# Patient Record
Sex: Female | Born: 1970 | Hispanic: Yes | Marital: Married | Smoking: Never smoker
Health system: Southern US, Community
[De-identification: ages and names within clinical notes are randomized; demographics above are authoritative.]

## PROBLEM LIST (undated history)

## (undated) DIAGNOSIS — E785 Hyperlipidemia, unspecified: Secondary | ICD-10-CM

## (undated) DIAGNOSIS — K219 Gastro-esophageal reflux disease without esophagitis: Secondary | ICD-10-CM

## (undated) DIAGNOSIS — E1140 Type 2 diabetes mellitus with diabetic neuropathy, unspecified: Secondary | ICD-10-CM

## (undated) DIAGNOSIS — F419 Anxiety disorder, unspecified: Secondary | ICD-10-CM

## (undated) DIAGNOSIS — F32A Depression, unspecified: Secondary | ICD-10-CM

## (undated) DIAGNOSIS — E119 Type 2 diabetes mellitus without complications: Secondary | ICD-10-CM

## (undated) DIAGNOSIS — F329 Major depressive disorder, single episode, unspecified: Secondary | ICD-10-CM

## (undated) HISTORY — DX: Gastro-esophageal reflux disease without esophagitis: K21.9

## (undated) HISTORY — DX: Anxiety disorder, unspecified: F41.9

## (undated) HISTORY — DX: Type 2 diabetes mellitus with diabetic neuropathy, unspecified: E11.40

## (undated) HISTORY — DX: Depression, unspecified: F32.A

## (undated) HISTORY — DX: Hyperlipidemia, unspecified: E78.5

---
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
## 2015-05-25 DIAGNOSIS — B3731 Acute candidiasis of vulva and vagina: Secondary | ICD-10-CM | POA: Insufficient documentation

## 2015-05-28 ENCOUNTER — Ambulatory Visit: Payer: Self-pay

## 2016-05-24 DIAGNOSIS — F418 Other specified anxiety disorders: Secondary | ICD-10-CM | POA: Insufficient documentation

## 2016-08-30 DIAGNOSIS — B353 Tinea pedis: Secondary | ICD-10-CM | POA: Insufficient documentation

## 2016-08-30 DIAGNOSIS — E785 Hyperlipidemia, unspecified: Secondary | ICD-10-CM | POA: Insufficient documentation

## 2017-03-31 ENCOUNTER — Emergency Department (HOSPITAL_COMMUNITY): Payer: Self-pay

## 2017-03-31 ENCOUNTER — Observation Stay (HOSPITAL_COMMUNITY)
Admission: EM | Admit: 2017-03-31 | Discharge: 2017-04-01 | Disposition: A | Discharge status: Home / Self Care | Payer: Self-pay | Attending: Internal Medicine | Admitting: Internal Medicine

## 2017-03-31 ENCOUNTER — Encounter (HOSPITAL_COMMUNITY): Payer: Self-pay

## 2017-03-31 DIAGNOSIS — Z9119 Patient's noncompliance with other medical treatment and regimen: Secondary | ICD-10-CM | POA: Insufficient documentation

## 2017-03-31 DIAGNOSIS — R824 Acetonuria: Secondary | ICD-10-CM

## 2017-03-31 DIAGNOSIS — J322 Chronic ethmoidal sinusitis: Secondary | ICD-10-CM | POA: Insufficient documentation

## 2017-03-31 DIAGNOSIS — R5381 Other malaise: Secondary | ICD-10-CM

## 2017-03-31 DIAGNOSIS — E1110 Type 2 diabetes mellitus with ketoacidosis without coma: Principal | ICD-10-CM | POA: Diagnosis present

## 2017-03-31 DIAGNOSIS — F419 Anxiety disorder, unspecified: Secondary | ICD-10-CM | POA: Insufficient documentation

## 2017-03-31 DIAGNOSIS — R112 Nausea with vomiting, unspecified: Secondary | ICD-10-CM

## 2017-03-31 DIAGNOSIS — R5383 Other fatigue: Secondary | ICD-10-CM

## 2017-03-31 DIAGNOSIS — R519 Headache, unspecified: Secondary | ICD-10-CM

## 2017-03-31 DIAGNOSIS — J323 Chronic sphenoidal sinusitis: Secondary | ICD-10-CM | POA: Insufficient documentation

## 2017-03-31 DIAGNOSIS — J0100 Acute maxillary sinusitis, unspecified: Secondary | ICD-10-CM | POA: Insufficient documentation

## 2017-03-31 DIAGNOSIS — R51 Headache: Secondary | ICD-10-CM

## 2017-03-31 DIAGNOSIS — F329 Major depressive disorder, single episode, unspecified: Secondary | ICD-10-CM | POA: Insufficient documentation

## 2017-03-31 HISTORY — DX: Type 2 diabetes mellitus without complications: E11.9

## 2017-03-31 LAB — URINALYSIS, ROUTINE W REFLEX MICROSCOPIC
Bilirubin Urine: NEGATIVE
HGB URINE DIPSTICK: NEGATIVE
Ketones, ur: 20 mg/dL — AB
LEUKOCYTES UA: NEGATIVE
NITRITE: NEGATIVE
PH: 6 (ref 5.0–8.0)
Protein, ur: NEGATIVE mg/dL
RBC / HPF: NONE SEEN RBC/hpf (ref 0–5)
Specific Gravity, Urine: 1.015 (ref 1.005–1.030)
Squamous Epithelial / LPF: NONE SEEN

## 2017-03-31 LAB — COMPREHENSIVE METABOLIC PANEL
ALK PHOS: 95 U/L (ref 38–126)
ALT: 16 U/L (ref 14–54)
ANION GAP: 16 — AB (ref 5–15)
AST: 20 U/L (ref 15–41)
Albumin: 4 g/dL (ref 3.5–5.0)
BILIRUBIN TOTAL: 0.4 mg/dL (ref 0.3–1.2)
BUN: 14 mg/dL (ref 6–20)
CALCIUM: 9.8 mg/dL (ref 8.9–10.3)
CO2: 17 mmol/L — ABNORMAL LOW (ref 22–32)
Chloride: 101 mmol/L (ref 101–111)
Creatinine, Ser: 0.55 mg/dL (ref 0.44–1.00)
GFR calc non Af Amer: >60 mL/min (ref >60)
Glucose, Bld: 266 mg/dL — ABNORMAL HIGH (ref 65–99)
POTASSIUM: 4 mmol/L (ref 3.5–5.1)
SODIUM: 134 mmol/L — AB (ref 135–145)
TOTAL PROTEIN: 7.1 g/dL (ref 6.5–8.1)

## 2017-03-31 LAB — CBC
HEMATOCRIT: 39.8 % (ref 36.0–46.0)
HEMOGLOBIN: 14.5 g/dL (ref 12.0–15.0)
MCH: 32.1 pg (ref 26.0–34.0)
MCHC: 36.4 g/dL — ABNORMAL HIGH (ref 30.0–36.0)
MCV: 88.1 fL (ref 78.0–100.0)
Platelets: 302 10*3/uL (ref 150–400)
RBC: 4.52 MIL/uL (ref 3.87–5.11)
RDW: 12.2 % (ref 11.5–15.5)
WBC: 9.4 10*3/uL (ref 4.0–10.5)

## 2017-03-31 LAB — CBG MONITORING, ED: Glucose-Capillary: 218 mg/dL — ABNORMAL HIGH (ref 65–99)

## 2017-03-31 LAB — I-STAT BETA HCG BLOOD, ED (MC, WL, AP ONLY)

## 2017-03-31 LAB — LIPASE, BLOOD: Lipase: 25 U/L (ref 11–51)

## 2017-03-31 MED ORDER — SODIUM CHLORIDE 0.9 % IV BOLUS (SEPSIS)
1000.0000 mL | Freq: Once | INTRAVENOUS | Status: AC
Start: 2017-03-31 — End: 2017-03-31
  Administered 2017-03-31: 1000 mL via INTRAVENOUS

## 2017-03-31 MED ORDER — ONDANSETRON 4 MG PO TBDP
4.0000 mg | ORAL_TABLET | Freq: Once | ORAL | Status: AC | PRN
  Administered 2017-03-31: 4 mg via ORAL

## 2017-03-31 MED ORDER — SODIUM CHLORIDE 0.9 % IV BOLUS (SEPSIS)
1000.0000 mL | Freq: Once | INTRAVENOUS | Status: AC
  Administered 2017-03-31: 1000 mL via INTRAVENOUS

## 2017-03-31 MED ORDER — PROCHLORPERAZINE EDISYLATE 5 MG/ML IJ SOLN
10.0000 mg | Freq: Once | INTRAMUSCULAR | Status: AC
Start: 2017-03-31 — End: 2017-03-31
  Administered 2017-03-31: 10 mg via INTRAVENOUS
  Filled 2017-03-31: qty 2

## 2017-03-31 MED ORDER — DIPHENHYDRAMINE HCL 50 MG/ML IJ SOLN
25.0000 mg | Freq: Once | INTRAMUSCULAR | Status: AC
  Administered 2017-03-31: 25 mg via INTRAVENOUS
  Filled 2017-03-31: qty 1

## 2017-03-31 MED ORDER — SODIUM CHLORIDE 0.9 % IV SOLN
INTRAVENOUS | Status: AC
  Administered 2017-04-01: 01:00:00 via INTRAVENOUS

## 2017-03-31 MED ORDER — ONDANSETRON 4 MG PO TBDP
ORAL_TABLET | ORAL | Status: AC
  Filled 2017-03-31: qty 1

## 2017-03-31 NOTE — ED Notes (Signed)
  CBG 218  
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
## 2017-03-31 NOTE — ED Triage Notes (Signed)
Patient here with daughter who reports generalized headache intermittently x 3 weeks with nausea/vomiting and back pain with any movement. Crying on arrival. Patient denies trauma. Alert and oriented

## 2017-04-01 ENCOUNTER — Encounter (HOSPITAL_COMMUNITY): Payer: Self-pay | Admitting: Family Medicine

## 2017-04-01 DIAGNOSIS — R5381 Other malaise: Secondary | ICD-10-CM | POA: Diagnosis present

## 2017-04-01 DIAGNOSIS — J329 Chronic sinusitis, unspecified: Secondary | ICD-10-CM

## 2017-04-01 DIAGNOSIS — R5383 Other fatigue: Secondary | ICD-10-CM

## 2017-04-01 DIAGNOSIS — R112 Nausea with vomiting, unspecified: Secondary | ICD-10-CM | POA: Diagnosis present

## 2017-04-01 LAB — BASIC METABOLIC PANEL
ANION GAP: 9 (ref 5–15)
ANION GAP: 7 (ref 5–15)
BUN: 8 mg/dL (ref 6–20)
BUN: 8 mg/dL (ref 6–20)
CALCIUM: 8.4 mg/dL — AB (ref 8.9–10.3)
CALCIUM: 8.1 mg/dL — AB (ref 8.9–10.3)
CHLORIDE: 107 mmol/L (ref 101–111)
CHLORIDE: 107 mmol/L (ref 101–111)
CO2: 21 mmol/L — AB (ref 22–32)
CO2: 23 mmol/L (ref 22–32)
Creatinine, Ser: 0.49 mg/dL (ref 0.44–1.00)
Creatinine, Ser: 0.53 mg/dL (ref 0.44–1.00)
GFR calc Af Amer: >60 mL/min (ref >60)
GFR calc non Af Amer: >60 mL/min (ref >60)
GLUCOSE: 178 mg/dL — AB (ref 65–99)
Glucose, Bld: 161 mg/dL — ABNORMAL HIGH (ref 65–99)
Potassium: 3.6 mmol/L (ref 3.5–5.1)
Potassium: 3.7 mmol/L (ref 3.5–5.1)
Sodium: 137 mmol/L (ref 135–145)
Sodium: 137 mmol/L (ref 135–145)

## 2017-04-01 LAB — HIV ANTIBODY (ROUTINE TESTING W REFLEX): HIV Screen 4th Generation wRfx: NONREACTIVE

## 2017-04-01 LAB — GLUCOSE, CAPILLARY
GLUCOSE-CAPILLARY: 179 mg/dL — AB (ref 65–99)
Glucose-Capillary: 186 mg/dL — ABNORMAL HIGH (ref 65–99)
Glucose-Capillary: 146 mg/dL — ABNORMAL HIGH (ref 65–99)
Glucose-Capillary: 175 mg/dL — ABNORMAL HIGH (ref 65–99)

## 2017-04-01 MED ORDER — HYDROCODONE-ACETAMINOPHEN 5-325 MG PO TABS: 1.0000 | ORAL_TABLET | ORAL | Status: DC | PRN

## 2017-04-01 MED ORDER — PROMETHAZINE HCL 25 MG/ML IJ SOLN: 12.5000 mg | Freq: Four times a day (QID) | INTRAMUSCULAR | Status: DC | PRN

## 2017-04-01 MED ORDER — LIVING WELL WITH DIABETES BOOK - IN SPANISH: 1.0000 | Freq: Once | 0 refills | Status: AC

## 2017-04-01 MED ORDER — ONDANSETRON HCL 4 MG PO TABS: 4.0000 mg | ORAL_TABLET | Freq: Four times a day (QID) | ORAL | Status: DC | PRN

## 2017-04-01 MED ORDER — ACETAMINOPHEN 325 MG PO TABS: 650.0000 mg | ORAL_TABLET | Freq: Four times a day (QID) | ORAL | Status: DC | PRN

## 2017-04-01 MED ORDER — SODIUM CHLORIDE 0.9% FLUSH: 3.0000 mL | Freq: Two times a day (BID) | INTRAVENOUS | Status: DC

## 2017-04-01 MED ORDER — ACURA BLOOD GLUCOSE METER W/DEVICE KIT: 1.0000 [IU] | PACK | Freq: Every day | Status: DC

## 2017-04-01 MED ORDER — INSULIN ASPART 100 UNIT/ML ~~LOC~~ SOLN
0.0000 [IU] | SUBCUTANEOUS | Status: DC
  Administered 2017-04-01 (×2): 3 [IU] via SUBCUTANEOUS
  Administered 2017-04-01: 2 [IU] via SUBCUTANEOUS

## 2017-04-01 MED ORDER — ONDANSETRON HCL 4 MG/2ML IJ SOLN: 4.0000 mg | Freq: Four times a day (QID) | INTRAMUSCULAR | Status: DC | PRN

## 2017-04-01 MED ORDER — LIVING WELL WITH DIABETES BOOK - IN SPANISH
Freq: Once | Status: DC
Start: 2017-04-01 — End: 2017-04-01
  Filled 2017-04-01: qty 1

## 2017-04-01 MED ORDER — POLYETHYLENE GLYCOL 3350 17 G PO PACK: 17.0000 g | PACK | Freq: Every day | ORAL | Status: DC | PRN

## 2017-04-01 MED ORDER — ENOXAPARIN SODIUM 40 MG/0.4ML ~~LOC~~ SOLN
40.0000 mg | SUBCUTANEOUS | Status: DC
  Administered 2017-04-01: 40 mg via SUBCUTANEOUS
  Filled 2017-04-01: qty 0.4

## 2017-04-01 MED ORDER — INSULIN ASPART 100 UNIT/ML ~~LOC~~ SOLN
8.0000 [IU] | Freq: Once | SUBCUTANEOUS | Status: AC
  Administered 2017-04-01: 8 [IU] via SUBCUTANEOUS
  Filled 2017-04-01: qty 1

## 2017-04-01 MED ORDER — ACETAMINOPHEN 650 MG RE SUPP: 650.0000 mg | Freq: Four times a day (QID) | RECTAL | Status: DC | PRN

## 2017-04-01 NOTE — H&P (Signed)
History and Physical    Faith Alvarez WUJ:[id number] DOB: 07-29-1971 DOA: 03/31/2017  PCP: No PCP Per Patient   Patient coming from: Home  Chief Complaint: Nausea, vomiting, headache, malaise   HPI: Faith Alvarez is a 46 y.o. female with medical history significant for type 2 diabetes mellitus, presenting to the emergency department with headache, nonspecific malaise, abdominal discomfort, and nausea with nonbloody nonbilious vomiting. Patient is accompanied by family who assists with the history. She had reportedly been in her usual state until developing nausea with vomiting a couple days ago as well as a nonspecific malaise. She developed a headache today which is similar to the headache she is experienced in the past, but more constant and severe. She describes the headache as a pulsing pain which is constant and generalized to the entire head without change in vision or hearing or focal numbness or weakness. She reports that loud noises or lights makes the headache worse, and no alleviating factors are identified. There has not been any recent fall or trauma. She does not check her sugar at home. She denies abdominal pain, per se, but notes a generalized abdominal discomfort with nausea. There has not been any significant diarrhea and no melena or hematochezia. She denies any significant cough or dyspnea and denies chest pain or palpitations. She denies use of alcohol, illicit drugs, or tobacco.  ED Course: Upon arrival to the ED, patient is found to be afebrile, saturating adequately on room air, and with vital signs stable. Noncontrast head CT is negative for acute intracranial abnormality and chest x-ray is negative for acute cardiopulmonary disease. Chemstrip panel reveals a sodium of 134, bicarbonate of 17, serum glucose of 266, and anion gap of 16. CBC is unremarkable. Urinalysis features glucosuria and ketonuria. Patient was treated with 2 L normal saline and a migraine cocktail of  Benadryl, Compazine, and Zofran. Patient reported resolution of her headache with this, but continued to have nausea with vomiting. She will be observed on the telemetry unit for ongoing evaluation and management of headache, nausea, vomiting, and malaise suspected secondary to mild DKA.  Review of Systems:  All other systems reviewed and apart from HPI, are negative.  Past Medical History:  Diagnosis Date  . Diabetes mellitus without complication (HCC)     History reviewed. No pertinent surgical history.   reports that she has never smoked. She has never used smokeless tobacco. Her alcohol and drug histories are not on file.  No Known Allergies  History reviewed. No pertinent family history.   Prior to Admission medications   Medication Sig Start Date End Date Taking? Authorizing Provider  UNKNOWN TO PATIENT Medication for a vaginal infection that was caused by her diabetes. No one has any additional information.   Yes Historical Provider, MD    Physical Exam: Vitals:   03/31/17 2300 03/31/17 2330 04/01/17 0000 04/01/17 0030  BP: 111/66 112/74 108/65 108/67  Pulse: 74 79 91 75  Resp:      Temp:      TempSrc:      SpO2: 100% 100% 100% 100%      Constitutional: No respiratory distress, calm, appears uncomfortable  Eyes: PERTLA, lids and conjunctivae normal ENMT: Mucous membranes are moist. Posterior pharynx clear of any exudate or lesions.   Neck: normal, supple, no masses, no thyromegaly Respiratory: clear to auscultation bilaterally, no wheezing, no crackles. Normal respiratory effort.   Cardiovascular: S1 & S2 heard, regular rate and rhythm, hyperdynamic precordium. No extremity edema. No significant JVD.  Abdomen: No distension, soft, no masses palpated, mild generalized tenderness without rebound pain or guarding. Bowel sounds normal.  Musculoskeletal: no clubbing / cyanosis. No joint deformity upper and lower extremities.   Skin: no significant rashes, lesions,  ulcers. Warm, dry, well-perfused. Neurologic: CN 2-12 grossly intact. Sensation intact, DTR normal. Strength 5/5 in all 4 limbs.  Psychiatric: Alert and oriented x 3. Anxious, tearful.     Labs on Admission: I have personally reviewed following labs and imaging studies  CBC:  Recent Labs Lab 03/31/17 1827  WBC 9.4  HGB 14.5  HCT 39.8  MCV 88.1  PLT 302   Basic Metabolic Panel:  Recent Labs Lab 03/31/17 1827  NA 134*  K 4.0  CL 101  CO2 17*  GLUCOSE 266*  BUN 14  CREATININE 0.55  CALCIUM 9.8   GFR: CrCl cannot be calculated (Unknown ideal weight.). Liver Function Tests:  Recent Labs Lab 03/31/17 1827  AST 20  ALT 16  ALKPHOS 95  BILITOT 0.4  PROT 7.1  ALBUMIN 4.0    Recent Labs Lab 03/31/17 1827  LIPASE 25   No results for input(s): AMMONIA in the last 168 hours. Coagulation Profile: No results for input(s): INR, PROTIME in the last 168 hours. Cardiac Enzymes: No results for input(s): CKTOTAL, CKMB, CKMBINDEX, TROPONINI in the last 168 hours. BNP (last 3 results) No results for input(s): PROBNP in the last 8760 hours. HbA1C: No results for input(s): HGBA1C in the last 72 hours. CBG:  Recent Labs Lab 03/31/17 2355  GLUCAP 218*   Lipid Profile: No results for input(s): CHOL, HDL, LDLCALC, TRIG, CHOLHDL, LDLDIRECT in the last 72 hours. Thyroid Function Tests: No results for input(s): TSH, T4TOTAL, FREET4, T3FREE, THYROIDAB in the last 72 hours. Anemia Panel: No results for input(s): VITAMINB12, FOLATE, FERRITIN, TIBC, IRON, RETICCTPCT in the last 72 hours. Urine analysis:    Component Value Date/Time   COLORURINE YELLOW 03/31/2017 2231   APPEARANCEUR CLEAR 03/31/2017 2231   LABSPEC 1.015 03/31/2017 2231   PHURINE 6.0 03/31/2017 2231   GLUCOSEU >=500 (A) 03/31/2017 2231   HGBUR NEGATIVE 03/31/2017 2231   BILIRUBINUR NEGATIVE 03/31/2017 2231   KETONESUR 20 (A) 03/31/2017 2231   PROTEINUR NEGATIVE 03/31/2017 2231   NITRITE NEGATIVE  03/31/2017 2231   LEUKOCYTESUR NEGATIVE 03/31/2017 2231   Sepsis Labs: (procalcitonin:4,lacticidven:4) )No results found for this or any previous visit (from the past 240 hour(s)).   Radiological Exams on Admission: Dg Chest 2 View  Result Date: 03/31/2017 CLINICAL DATA:  Initial evaluation for acute cough for 3 weeks. EXAM: CHEST  2 VIEW COMPARISON:  None available. FINDINGS: Cardiac and mediastinal silhouettes are within normal limits. Lungs normally inflated. No focal infiltrates to suggest pneumonia. No pulmonary edema or pleural effusion. No pneumothorax. No acute osseous abnormality. IMPRESSION: No radiographic evidence for active cardiopulmonary disease. Electronically Signed   By: Rise Mu M.D.   On: 03/31/2017 22:13   Ct Head Wo Contrast  Result Date: 03/31/2017 CLINICAL DATA:  Headache eight weeks. Intermittent nausea and vomiting. EXAM: CT HEAD WITHOUT CONTRAST TECHNIQUE: Contiguous axial images were obtained from the base of the skull through the vertex without intravenous contrast. COMPARISON:  None. FINDINGS: Brain: No evidence of acute infarction, hemorrhage, hydrocephalus, extra-axial collection or mass lesion/mass effect. Vascular: No hyperdense vessel or unexpected calcification. Skull: Normal. Negative for fracture or focal lesion. Sinuses/Orbits: Almost complete opacification of the left maxillary sinus. Small amount of fluid in the right maxillary sinus. Extensive bilateral ethmoid and sphenoid sinus mucosal  thickening. Unremarkable orbits. Other: None. IMPRESSION: 1. No intracranial abnormality. 2. Mild acute right maxillary sinusitis. 3. Extensive chronic left maxillary, bilateral ethmoid and bilateral sphenoid sinusitis. Electronically Signed   By: Beckie Salts M.D.   On: 03/31/2017 22:07    EKG: Not performed, will obtain as appropriate.    Assessment/Plan  1. Intractable N/V  - Pt presents with non-specific malaise and nausea with nonbloody,  nonbilious vomiting  - Treated with Zofran and compazine in ED with improvement, but still not able to tolerate PO  - Likely secondary to mild DKA, will treat as below  - Continue IVF, antiemetics, start with liquids and advance diet as tolerated    2. Mild DKA  - Pt presents with hyperglycemia, low bicarb, elevated anion gap, and urine ketones  - She was fluid-resuscitated in ED with NS  - Plan to treat with sq insulin, continue IVF hydration, follow serial chem panels  3. Headache  - Resolved with migraine cocktail in ED  - Head CT negative for acute intracranial abnormality and no focal deficits  - Likely secondary to mild DKA, will monitor    DVT prophylaxis: sq Lovenox  Code Status: Full  Family Communication: Daughter updated at bedside Disposition Plan: Observe on telemetry Consults called: None Admission status: Observation    Briscoe Deutscher, MD [facility] Pager [phone]  If 7PM-7AM, please contact night-coverage [email or web address] Password [facility]  04/01/2017, 12:40 AM

## 2017-04-01 NOTE — Progress Notes (Signed)
Pt discharged home via wheelchair with family. IV and telemetry taken off. Pt is alert, oriented and stable. D/C paperwork/prescriptions given to pt and was reviewed with her and family. All belongings sent home with pt.

## 2017-04-01 NOTE — Discharge Summary (Signed)
Physician Discharge Summary  Faith Alvarez GTX:[id number] DOB: 25-Mar-1971 DOA: 03/31/2017  PCP: No PCP Per Patient  Admit date: 03/31/2017 Discharge date: 04/01/2017  Admitted From: Home  Disposition:  Home   Recommendations for Outpatient Follow-up:  1. Follow up with PCP in 1- week 2. Glucometer prescription given for home glucose monitoring 3. Patient was advised to be compliant with antihyperglycemic agents  Home Health: No  Equipment/Devices: Glucometer  Discharge Condition: Stable  CODE STATUS: full  Diet recommendation: Carb Modified   Brief/Interim Summary: 46 yo female presents with the chief complain of nausea and vomiting. Worsening symptoms for the last 2 days before admission, associated with headache and generalized malaise. GI symptoms were preceded by an upper respiratory tract infection. Patient admits been noncompliant with her antihyperglycemic agents. On the initial evaluation, patient found hemodynamically stable. Moist mucous membranes, lungs clear to auscultation, heart S1-S2 present rhythmic, abdomen soft and non-teder, no lower extremity edema. Sodium 134, potassium 4.0, chloride 101, bicarbonate 17, glucose 266, BUN 14, creatinine 0.55, calcium 9.8, anion gap 16, white count 9.4, hemoglobin 14.5, hematocrit 9.8, platelets 302. Urinalysis with more 500 glucose, 20 ketones, negative proteins, 0-5 white cells, pregnancy test negative. Head CT with no acute intracranial abnormalities, mild acute right maxillary sinusitis, extensive chronic left maxillary, bilateral ethmoidal and bilateral sphenoid sinusitis. Chest x-ray was negative for infiltrates.   The patient was admitted to the hospital with the working diagnosis of mild diabetes ketoacidosis, complicated with nausea or vomiting.   1. Diabetes ketoacidosis. Patient was admitted to the medical unit, she received isotonic IV fluids with improvement of ketoacidosis and glycemia. Glucose monitoring with insulin  sliding-scale, capillary glucose 186, 146, 175, 179. The anion gap by the time of discharge was down to 7. Diet was advanced progressively with good toleration. Patient will resume glipizide and metformin at the time discharge, she was encouraged to be compliant with her medications. Glucometer had been prescribed and instructed to check capillary glucose every morning and keep log.  2. Intractable nausea and vomiting. It is presumed to be related to diabetes ketoacidosis, by the time of discharge no further symptoms, patient tolerated by mouth diet adequately.  3. Sinusitis. Symptoms have improved, patient will be referred to the outpatient primary care clinic for further management.  Discharge Diagnoses:  Principal Problem:   Intractable nausea and vomiting Active Problems:   DKA (diabetic ketoacidoses) (HCC)   Malaise and fatigue    Discharge Instructions   Allergies as of 04/01/2017   No Known Allergies     Medication List    TAKE these medications   ACURA BLOOD GLUCOSE METER w/Device Kit 1 Units by Does not apply route daily.   fluconazole 150 MG tablet Commonly known as:  DIFLUCAN Take 150 mg by mouth once. Was given 2 tablets on 4/13 per pharmacy. Was to take one dose and then repeat  in 72 hours.   glipiZIDE 5 MG tablet Commonly known as:  GLUCOTROL Take 5 mg by mouth daily before breakfast.   lisinopril 2.5 MG tablet Commonly known as:  PRINIVIL,ZESTRIL Take 2.5 mg by mouth daily.   living well with diabetes book- in [place] 1 each by Does not apply route once.   metFORMIN 1000 MG tablet Commonly known as:  GLUCOPHAGE Take 1,000 mg by mouth 2 (two) times daily with a meal.       No Known Allergies  Consultations:     Procedures/Studies: Dg Chest 2 View  Result Date: 03/31/2017 CLINICAL DATA:  Initial evaluation for acute cough for 3 weeks. EXAM: CHEST  2 VIEW COMPARISON:  None available. FINDINGS: Cardiac and mediastinal silhouettes are within  normal limits. Lungs normally inflated. No focal infiltrates to suggest pneumonia. No pulmonary edema or pleural effusion. No pneumothorax. No acute osseous abnormality. IMPRESSION: No radiographic evidence for active cardiopulmonary disease. Electronically Signed   By: Jeannine Boga M.D.   On: 03/31/2017 22:13   Ct Head Wo Contrast  Result Date: 03/31/2017 CLINICAL DATA:  Headache eight weeks. Intermittent nausea and vomiting. EXAM: CT HEAD WITHOUT CONTRAST TECHNIQUE: Contiguous axial images were obtained from the base of the skull through the vertex without intravenous contrast. COMPARISON:  None. FINDINGS: Brain: No evidence of acute infarction, hemorrhage, hydrocephalus, extra-axial collection or mass lesion/mass effect. Vascular: No hyperdense vessel or unexpected calcification. Skull: Normal. Negative for fracture or focal lesion. Sinuses/Orbits: Almost complete opacification of the left maxillary sinus. Small amount of fluid in the right maxillary sinus. Extensive bilateral ethmoid and sphenoid sinus mucosal thickening. Unremarkable orbits. Other: None. IMPRESSION: 1. No intracranial abnormality. 2. Mild acute right maxillary sinusitis. 3. Extensive chronic left maxillary, bilateral ethmoid and bilateral sphenoid sinusitis. Electronically Signed   By: Claudie Revering M.D.   On: 03/31/2017 22:07        Subjective: Patient feeling better, no further nausea or vomiting, she is feeling back to her baseline.  Discharge Exam: Vitals:   04/01/17 0122 04/01/17 0559  BP: 109/71 96/63  Pulse: 86 80  Resp: 18 19  Temp: 98.7 F (37.1 C) 98.2 F (36.8 C)   Vitals:   04/01/17 0122 04/01/17 0500 04/01/17 0559 04/01/17 0700  BP: 109/71  96/63   Pulse: 86  80   Resp: 18  19   Temp: 98.7 F (37.1 C)  98.2 F (36.8 C)   TempSrc: Oral     SpO2: 99%  98%   Weight:  58.2 kg (128 lb 4.9 oz)    Height:    5' 11"  (1.803 m)    General: Pt is alert, awake, not in acute distress Cardiovascular:  RRR, S1/S2 +, no rubs, no gallops Respiratory: CTA bilaterally, no wheezing, no rhonchi Abdominal: Soft, NT, ND, bowel sounds + Extremities: no edema, no cyanosis    The results of significant diagnostics from this hospitalization (including imaging, microbiology, ancillary and laboratory) are listed below for reference.     Microbiology: No results found for this or any previous visit (from the past 240 hour(s)).   Labs: BNP (last 3 results) No results for input(s): BNP in the last 8760 hours. Basic Metabolic Panel:  Recent Labs Lab 03/31/17 1827 04/01/17 0206 04/01/17 0612  NA 134* 137 137  K 4.0 3.6 3.7  CL 101 107 107  CO2 17* 21* 23  GLUCOSE 266* 178* 161*  BUN 14 8 8   CREATININE 0.55 0.49 0.53  CALCIUM 9.8 8.4* 8.1*   Liver Function Tests:  Recent Labs Lab 03/31/17 1827  AST 20  ALT 16  ALKPHOS 95  BILITOT 0.4  PROT 7.1  ALBUMIN 4.0    Recent Labs Lab 03/31/17 1827  LIPASE 25   No results for input(s): AMMONIA in the last 168 hours. CBC:  Recent Labs Lab 03/31/17 1827  WBC 9.4  HGB 14.5  HCT 39.8  MCV 88.1  PLT 302   Cardiac Enzymes: No results for input(s): CKTOTAL, CKMB, CKMBINDEX, TROPONINI in the last 168 hours. BNP: Invalid input(s): POCBNP CBG:  Recent Labs Lab 03/31/17 2355 04/01/17 0200 04/01/17 0326 04/01/17  7989 04/01/17 1241  GLUCAP 218* 186* 146* 175* 179*   D-Dimer No results for input(s): DDIMER in the last 72 hours. Hgb A1c No results for input(s): HGBA1C in the last 72 hours. Lipid Profile No results for input(s): CHOL, HDL, LDLCALC, TRIG, CHOLHDL, LDLDIRECT in the last 72 hours. Thyroid function studies No results for input(s): TSH, T4TOTAL, T3FREE, THYROIDAB in the last 72 hours.  Invalid input(s): FREET3 Anemia work up No results for input(s): VITAMINB12, FOLATE, FERRITIN, TIBC, IRON, RETICCTPCT in the last 72 hours. Urinalysis    Component Value Date/Time   COLORURINE YELLOW 03/31/2017 2231    APPEARANCEUR CLEAR 03/31/2017 2231   LABSPEC 1.015 03/31/2017 2231   PHURINE 6.0 03/31/2017 2231   GLUCOSEU >=500 (A) 03/31/2017 2231   HGBUR NEGATIVE 03/31/2017 2231   BILIRUBINUR NEGATIVE 03/31/2017 2231   KETONESUR 20 (A) 03/31/2017 2231   PROTEINUR NEGATIVE 03/31/2017 2231   NITRITE NEGATIVE 03/31/2017 2231   LEUKOCYTESUR NEGATIVE 03/31/2017 2231   Sepsis Labs Invalid input(s): PROCALCITONIN,  WBC,  LACTICIDVEN Microbiology No results found for this or any previous visit (from the past 240 hour(s)).   Time coordinating discharge: 45 minutes  SIGNED:   Tawni Millers, MD  [facility] 04/01/2017, 1:14 PM Pager   If 7PM-7AM, please contact night-coverage [email or web address] Password [email or web address]

## 2017-04-01 NOTE — Progress Notes (Signed)
Patient arrived to the unit via bed from the emergency department.  Patient is ambulatory.  Patient only speaks [place].  Daughter is at bedside to translate.  Patient is alert and oriented x4. No complaints of pain. Vital signs stable.  Skin assessment  Performed.  No skin issues. IV to the left antecubital with NS infusing.  Educated the patient and family on how to reach the staff on the unit. Placed the call light within reach of the patient and lowered the bed. Will continue to monitor the patient.

## 2017-04-02 LAB — HEMOGLOBIN A1C
Hgb A1c MFr Bld: 10.6 % — ABNORMAL HIGH (ref 4.8–5.6)
Mean Plasma Glucose: 258 mg/dL

## 2017-04-09 DIAGNOSIS — J302 Other seasonal allergic rhinitis: Secondary | ICD-10-CM | POA: Insufficient documentation

## 2017-04-30 ENCOUNTER — Encounter (HOSPITAL_COMMUNITY): Payer: Self-pay | Admitting: Emergency Medicine

## 2017-04-30 ENCOUNTER — Emergency Department (HOSPITAL_COMMUNITY)
Admission: EM | Admit: 2017-04-30 | Discharge: 2017-04-30 | Disposition: A | Discharge status: Home / Self Care | Payer: Self-pay | Attending: Emergency Medicine | Admitting: Emergency Medicine

## 2017-04-30 DIAGNOSIS — Z79899 Other long term (current) drug therapy: Secondary | ICD-10-CM | POA: Insufficient documentation

## 2017-04-30 DIAGNOSIS — R51 Headache: Secondary | ICD-10-CM | POA: Insufficient documentation

## 2017-04-30 DIAGNOSIS — Z7984 Long term (current) use of oral hypoglycemic drugs: Secondary | ICD-10-CM | POA: Insufficient documentation

## 2017-04-30 DIAGNOSIS — R519 Headache, unspecified: Secondary | ICD-10-CM

## 2017-04-30 DIAGNOSIS — R05 Cough: Secondary | ICD-10-CM | POA: Insufficient documentation

## 2017-04-30 DIAGNOSIS — R059 Cough, unspecified: Secondary | ICD-10-CM

## 2017-04-30 DIAGNOSIS — E119 Type 2 diabetes mellitus without complications: Secondary | ICD-10-CM | POA: Insufficient documentation

## 2017-04-30 LAB — URINALYSIS, ROUTINE W REFLEX MICROSCOPIC
BILIRUBIN URINE: NEGATIVE
GLUCOSE, UA: 50 mg/dL — AB
Hgb urine dipstick: NEGATIVE
KETONES UR: NEGATIVE mg/dL
LEUKOCYTES UA: NEGATIVE
Nitrite: NEGATIVE
PH: 6 (ref 5.0–8.0)
Protein, ur: NEGATIVE mg/dL
Specific Gravity, Urine: 1.009 (ref 1.005–1.030)

## 2017-04-30 LAB — CBC WITH DIFFERENTIAL/PLATELET
BASOS ABS: 0 10*3/uL (ref 0.0–0.1)
BASOS PCT: 0 %
Eosinophils Absolute: 0.1 10*3/uL (ref 0.0–0.7)
Eosinophils Relative: 1 %
HEMATOCRIT: 39 % (ref 36.0–46.0)
Hemoglobin: 13.2 g/dL (ref 12.0–15.0)
LYMPHS PCT: 31 %
Lymphs Abs: 2.4 10*3/uL (ref 0.7–4.0)
MCH: 30.5 pg (ref 26.0–34.0)
MCHC: 33.8 g/dL (ref 30.0–36.0)
MCV: 90.1 fL (ref 78.0–100.0)
MONO ABS: 0.5 10*3/uL (ref 0.1–1.0)
MONOS PCT: 6 %
Neutro Abs: 5 10*3/uL (ref 1.7–7.7)
Neutrophils Relative %: 62 %
PLATELETS: 291 10*3/uL (ref 150–400)
RBC: 4.33 MIL/uL (ref 3.87–5.11)
RDW: 12.1 % (ref 11.5–15.5)
WBC: 8 10*3/uL (ref 4.0–10.5)

## 2017-04-30 LAB — COMPREHENSIVE METABOLIC PANEL
ALK PHOS: 84 U/L (ref 38–126)
ALT: 13 U/L — AB (ref 14–54)
AST: 18 U/L (ref 15–41)
Albumin: 3.6 g/dL (ref 3.5–5.0)
Anion gap: 8 (ref 5–15)
BUN: 10 mg/dL (ref 6–20)
CALCIUM: 9.2 mg/dL (ref 8.9–10.3)
CO2: 23 mmol/L (ref 22–32)
Chloride: 103 mmol/L (ref 101–111)
Creatinine, Ser: 0.51 mg/dL (ref 0.44–1.00)
GFR calc non Af Amer: >60 mL/min (ref >60)
GLUCOSE: 210 mg/dL — AB (ref 65–99)
Potassium: 4.1 mmol/L (ref 3.5–5.1)
SODIUM: 134 mmol/L — AB (ref 135–145)
TOTAL PROTEIN: 6.8 g/dL (ref 6.5–8.1)
Total Bilirubin: 0.5 mg/dL (ref 0.3–1.2)

## 2017-04-30 LAB — PREGNANCY, URINE: Preg Test, Ur: NEGATIVE

## 2017-04-30 MED ORDER — METOCLOPRAMIDE HCL 5 MG/ML IJ SOLN
10.0000 mg | Freq: Once | INTRAMUSCULAR | Status: AC
  Administered 2017-04-30: 10 mg via INTRAVENOUS
  Filled 2017-04-30: qty 2

## 2017-04-30 MED ORDER — KETOROLAC TROMETHAMINE 30 MG/ML IJ SOLN
30.0000 mg | Freq: Once | INTRAMUSCULAR | Status: AC
  Administered 2017-04-30: 30 mg via INTRAVENOUS
  Filled 2017-04-30: qty 1

## 2017-04-30 MED ORDER — METHYLPREDNISOLONE SODIUM SUCC 125 MG IJ SOLR
125.0000 mg | Freq: Once | INTRAMUSCULAR | Status: AC
  Administered 2017-04-30: 125 mg via INTRAVENOUS
  Filled 2017-04-30: qty 2

## 2017-04-30 MED ORDER — SODIUM CHLORIDE 0.9 % IV BOLUS (SEPSIS)
1000.0000 mL | Freq: Once | INTRAVENOUS | Status: AC
  Administered 2017-04-30: 1000 mL via INTRAVENOUS

## 2017-04-30 NOTE — ED Notes (Signed)
Pt up to walk w/o diff

## 2017-04-30 NOTE — ED Provider Notes (Signed)
[facility] DEPT Provider Note   CSN: [id number] Arrival date & time: 04/30/17  1102     History   Chief Complaint Chief Complaint  Patient presents with  . Migraine    HPI Angelisa Winthrop is a 46 y.o. female.  HPI    Jacinda Kanady is a 46 y.o. female, with a history of DM and migraines, presenting to the ED with persistent headache over the last two months.   States her headache will persist throughout the day, unless she takes Advil. She takes 200-400 mg. Last took Advil early this morning. Headache is usually global, but for the last two weeks she has had left frontal and left sided facial pain, described as a pressure, rates it 5/10, nonradiating.   States she has a history of migraines. When she lived in [place] five years ago, she had migraines. She then began to have them again after moving to [place], but they have worsened.  Also endorse nonproductive cough for last two months.  Denies rhinorrhea, fever/chills, N/V, dizziness/lightheadedness, chest pain, shortness of breath, neuro deficits, or any other complaints.  LMP end of April. Patient's daughter is at the bedside and translates for patient.   Past Medical History:  Diagnosis Date  . Diabetes mellitus without complication [facility])     Patient Active Problem List   Diagnosis Date Noted  . Intractable nausea and vomiting 04/01/2017  . Malaise and fatigue 04/01/2017  . DKA (diabetic ketoacidoses) ([facility]) 03/31/2017    History reviewed. No pertinent surgical history.  OB History    No data available       Home Medications    Prior to Admission medications   Medication Sig Start Date End Date Taking? Authorizing Provider  benzonatate (TESSALON) 100 MG capsule Take by mouth 3 (three) times daily as needed for cough.   Yes [provider]  fluconazole (DIFLUCAN) 150 MG tablet Take 150 mg by mouth once. Was given 2 tablets on 4/13 per pharmacy. Was to take one dose and then repeat  in 72 hours.    Yes [provider]  glipiZIDE (GLUCOTROL) 10 MG tablet Take 10 mg by mouth daily before breakfast.    Yes [provider]  loratadine (CLARITIN) 10 MG tablet Take 10 mg by mouth daily.   Yes [provider]  metFORMIN (GLUCOPHAGE) 1000 MG tablet Take 1,000 mg by mouth 2 (two) times daily with a meal.   Yes [provider]  Blood Glucose Monitoring Suppl (ACURA BLOOD GLUCOSE METER) w/Device KIT 1 Units by Does not apply route daily. 04/01/17   Arrien, Jimmy Picket, MD    Family History No family history on file.  Social History Social History  Substance Use Topics  . Smoking status: Never Smoker  . Smokeless tobacco: Never Used  . Alcohol use Not on file     Allergies   Patient has no known allergies.   Review of Systems Review of Systems  Constitutional: Negative for chills, diaphoresis and fever.  HENT: Positive for congestion, sinus pressure and trouble swallowing. Negative for rhinorrhea.   Respiratory: Positive for cough. Negative for shortness of breath.   Cardiovascular: Negative for chest pain.  Gastrointestinal: Negative for abdominal pain, nausea and vomiting.  Neurological: Positive for headaches. Negative for dizziness, syncope, weakness, light-headedness and numbness.  All other systems reviewed and are negative.    Physical Exam Updated Vital Signs BP 120/72 (BP Location: Right Arm)   Pulse 75   Temp 98.6 F (37 C) (Oral)  Resp 16   SpO2 99%   Physical Exam  Constitutional: She appears well-developed and well-nourished. No distress.  HENT:  Head: Normocephalic and atraumatic.  Right Ear: Tympanic membrane and ear canal normal.  Left Ear: Tympanic membrane and ear canal normal.  Nose: Nose normal. Right sinus exhibits no maxillary sinus tenderness and no frontal sinus tenderness. Left sinus exhibits no maxillary sinus tenderness and no frontal sinus tenderness.  Mouth/Throat: Oropharynx is clear and moist and  mucous membranes are normal.  Eyes: Conjunctivae and EOM are normal. Pupils are equal, round, and reactive to light.  Neck: Normal range of motion. Neck supple.  Cardiovascular: Normal rate, regular rhythm, normal heart sounds and intact distal pulses.   Pulmonary/Chest: Effort normal and breath sounds normal. No respiratory distress.  Abdominal: Soft. There is no tenderness. There is no guarding.  Musculoskeletal: She exhibits no edema.  Normal motor function intact in all extremities and spine. No midline spinal tenderness.   Lymphadenopathy:    She has no cervical adenopathy.  Neurological: She is alert.  No sensory deficits. Strength 5/5 in all extremities. No gait disturbance. Coordination intact including heel to shin and finger to nose. Cranial nerves III-XII grossly intact. No facial droop.   Skin: Skin is warm and dry. She is not diaphoretic.  Psychiatric: She has a normal mood and affect. Her behavior is normal.  Nursing note and vitals reviewed.    ED Treatments / Results  Labs (all labs ordered are listed, but only abnormal results are displayed) Labs Reviewed  COMPREHENSIVE METABOLIC PANEL - Abnormal; Notable for the following:       Result Value   Sodium 134 (*)    Glucose, Bld 210 (*)    ALT 13 (*)    All other components within normal limits  URINALYSIS, ROUTINE W REFLEX MICROSCOPIC - Abnormal; Notable for the following:    Glucose, UA 50 (*)    All other components within normal limits  CBC WITH DIFFERENTIAL/PLATELET  PREGNANCY, URINE    EKG  EKG Interpretation None       Radiology No results found.  Procedures Procedures (including critical care time)  Medications Ordered in ED Medications  ketorolac (TORADOL) 30 MG/ML injection 30 mg (30 mg Intravenous Given 04/30/17 1147)  sodium chloride 0.9 % bolus 1,000 mL (0 mLs Intravenous Stopped 04/30/17 1247)  metoCLOPramide (REGLAN) injection 10 mg (10 mg Intravenous Given 04/30/17 1147)    methylPREDNISolone sodium succinate (SOLU-MEDROL) 125 mg/2 mL injection 125 mg (125 mg Intravenous Given 04/30/17 1147)     Initial Impression / Assessment and Plan / ED Course  I have reviewed the triage vital signs and the nursing notes.  Pertinent labs & imaging results that were available during my care of the patient were reviewed by me and considered in my medical decision making (see chart for details).  Clinical Course as of Apr 30 1409  Mon Apr 30, 2017  1220 Patient states she feels much better. Her headache has resolved.   [SJ]    Clinical Course User Index [SJ] Liba Hulsey C, PA-C     Patient presents with complaint of headache as well as recurrent mild dry cough. Patient has no red flag symptoms. No neuro or functional deficits. No signs of obvious distress. Patient's symptoms resolved with conservative management. No coughing noted while patient was in the ED. Coughing may be due to lisinopril.  PCP and possible neurology follow-up. The patient was given instructions for home care as well as return  precautions. Patient voices understanding of these instructions, accepts the plan, and is comfortable with discharge.   Findings and plan of care discussed with Gareth Morgan, MD.   Vitals:   04/30/17 1200 04/30/17 1215 04/30/17 1230 04/30/17 1356  BP: 105/60   100/66  Pulse: 85 87 80 78  Resp:    16  Temp:      TempSrc:      SpO2: 100% 100% 100% 99%     Final Clinical Impressions(s) / ED Diagnoses   Final diagnoses:  Bad headache  Cough    New Prescriptions New Prescriptions   No medications on file     Layla Maw 04/30/17 [place], MD 05/01/17 0700

## 2017-04-30 NOTE — ED Notes (Signed)
Pt states feels better, up to [facility] ua to lab

## 2017-04-30 NOTE — Discharge Instructions (Signed)
Be sure to stay well hydrated. May take ibuprofen for headache as well as tylenol. Be sure to take Claritin or Zyrtec daily for environmental allergies. May try saline nasal and sinus rinses for congestion.  Your cough may be due to your lisinopril. Please talk to your primary care provider on this as you may need to be switched to a different blood pressure medication. Follow up with a primary care provider on these issues.  Follow up with a neurologist, as needed, for continued issues with controlling migraine headaches.

## 2017-04-30 NOTE — ED Triage Notes (Signed)
Sudden h/a this am states it is her forehead that hurts , has had a cough x 1 month no n/v states has hx of migrains in [place] and they started agagin when she moved here

## 2017-06-14 ENCOUNTER — Encounter (HOSPITAL_COMMUNITY): Payer: Self-pay | Admitting: Emergency Medicine

## 2017-06-14 ENCOUNTER — Emergency Department (HOSPITAL_COMMUNITY)
Admission: EM | Admit: 2017-06-14 | Discharge: 2017-06-14 | Disposition: A | Discharge status: Home / Self Care | Payer: Self-pay | Attending: Emergency Medicine | Admitting: Emergency Medicine

## 2017-06-14 DIAGNOSIS — E119 Type 2 diabetes mellitus without complications: Secondary | ICD-10-CM | POA: Insufficient documentation

## 2017-06-14 DIAGNOSIS — Z7984 Long term (current) use of oral hypoglycemic drugs: Secondary | ICD-10-CM | POA: Insufficient documentation

## 2017-06-14 DIAGNOSIS — W260XXA Contact with knife, initial encounter: Secondary | ICD-10-CM | POA: Insufficient documentation

## 2017-06-14 DIAGNOSIS — S61122A Laceration with foreign body of left thumb with damage to nail, initial encounter: Secondary | ICD-10-CM | POA: Insufficient documentation

## 2017-06-14 DIAGNOSIS — Y929 Unspecified place or not applicable: Secondary | ICD-10-CM | POA: Insufficient documentation

## 2017-06-14 DIAGNOSIS — Y9389 Activity, other specified: Secondary | ICD-10-CM | POA: Insufficient documentation

## 2017-06-14 DIAGNOSIS — Y999 Unspecified external cause status: Secondary | ICD-10-CM | POA: Insufficient documentation

## 2017-06-14 DIAGNOSIS — Z79899 Other long term (current) drug therapy: Secondary | ICD-10-CM | POA: Insufficient documentation

## 2017-06-14 DIAGNOSIS — S61209A Unspecified open wound of unspecified finger without damage to nail, initial encounter: Secondary | ICD-10-CM

## 2017-06-14 LAB — CBG MONITORING, ED: Glucose-Capillary: 130 mg/dL — ABNORMAL HIGH (ref 65–99)

## 2017-06-14 MED ORDER — TETANUS-DIPHTH-ACELL PERTUSSIS 5-2.5-18.5 LF-MCG/0.5 IM SUSP
0.5000 mL | Freq: Once | INTRAMUSCULAR | Status: AC
Start: 2017-06-14 — End: 2017-06-14
  Administered 2017-06-14: 0.5 mL via INTRAMUSCULAR
  Filled 2017-06-14: qty 0.5

## 2017-06-14 MED ORDER — OXYCODONE-ACETAMINOPHEN 5-325 MG PO TABS
1.0000 | ORAL_TABLET | Freq: Once | ORAL | Status: AC
  Administered 2017-06-14: 1 via ORAL
  Filled 2017-06-14: qty 1

## 2017-06-14 MED ORDER — OXYCODONE-ACETAMINOPHEN 5-325 MG PO TABS: 2.0000 | ORAL_TABLET | ORAL | 0 refills | Status: DC | PRN

## 2017-06-14 MED ORDER — LORAZEPAM 0.5 MG PO TABS
0.5000 mg | ORAL_TABLET | Freq: Once | ORAL | Status: AC
  Administered 2017-06-14: 0.5 mg via ORAL
  Filled 2017-06-14: qty 1

## 2017-06-14 MED ORDER — LIDOCAINE HCL 2 % IJ SOLN
10.0000 mL | Freq: Once | INTRAMUSCULAR | Status: AC
  Administered 2017-06-14: 200 mg
  Filled 2017-06-14: qty 20

## 2017-06-14 NOTE — ED Provider Notes (Signed)
[facility] DEPT Provider Note   CSN: [id number] Arrival date & time: 06/14/17  1310  By signing my name below, I, Faith Alvarez, attest that this documentation has been prepared under the direction and in the presence of non-physician practitioner, Emmanuell Kantz A., PA-C. Electronically Signed: Theresia Alvarez, ED Scribe. 06/14/17. 4:58 PM.  History   Chief Complaint Chief Complaint  Patient presents with  . Extremity Laceration   The history is provided by the patient. A language interpreter was used ([place]).   HPI Comments: Faith Alvarez is a 46 y.o. female with a PMHx of DM, who presents to the Emergency Department complaining of a sudden onset, moderate avulsion would to left thumb that occurred 2 hours ago. Pt was working in the kitchen when she cut her thumb with a knife. At work, they bandaged her thumb with gauze but the bleeding is still persistent. Per pt, the pain is throbbing and radiating from her thumb up her arm. No pain medications tried prior to arrival. Pt states she is unsure of the status of her blood sugar but feels that it has probably been high recently. Tetanus is not UTD. No other complaints at this time. NKDA.   Past Medical History:  Diagnosis Date  . Diabetes mellitus without complication [facility])     Patient Active Problem List   Diagnosis Date Noted  . Intractable nausea and vomiting 04/01/2017  . Malaise and fatigue 04/01/2017  . DKA (diabetic ketoacidoses) ([place]) 03/31/2017    History reviewed. No pertinent surgical history.  OB History    No data available       Home Medications    Prior to Admission medications   Medication Sig Start Date End Date Taking? Authorizing Provider  benzonatate (TESSALON) 100 MG capsule Take by mouth 3 (three) times daily as needed for cough.    [provider]  Blood Glucose Monitoring Suppl (ACURA BLOOD GLUCOSE METER) w/Device KIT 1 Units by Does not apply route daily. 04/01/17   Arrien, Jimmy Picket, MD  fluconazole (DIFLUCAN) 150 MG tablet Take 150 mg by mouth once. Was given 2 tablets on 4/13 per pharmacy. Was to take one dose and then repeat  in 72 hours.    [provider]  glipiZIDE (GLUCOTROL) 10 MG tablet Take 10 mg by mouth daily before breakfast.     [provider]  loratadine (CLARITIN) 10 MG tablet Take 10 mg by mouth daily.    [provider]  metFORMIN (GLUCOPHAGE) 1000 MG tablet Take 1,000 mg by mouth 2 (two) times daily with a meal.    [provider]  oxyCODONE-acetaminophen (PERCOCET/ROXICET) 5-325 MG tablet Take 2 tablets by mouth every 4 (four) hours as needed for severe pain. 06/14/17   Zhanae Proffit A, PA-C    Family History No family history on file.  Social History Social History  Substance Use Topics  . Smoking status: Never Smoker  . Smokeless tobacco: Never Used  . Alcohol use Not on file     Allergies   Patient has no known allergies.   Review of Systems Review of Systems  Constitutional: Negative for activity change.  Respiratory: Negative for shortness of breath.   Cardiovascular: Negative for chest pain.  Gastrointestinal: Negative for abdominal pain.  Musculoskeletal: Positive for myalgias. Negative for back pain.  Skin: Positive for wound. Negative for rash.     Physical Exam Updated Vital Signs BP 110/69 (BP Location: Right Arm)   Pulse 72   Temp 98.6 F (37  C) (Oral)   Resp 16   Ht 5' 2"  (1.575 m)   LMP 05/30/2017   SpO2 100%   Physical Exam  Constitutional: No distress.  HENT:  Head: Normocephalic.  Eyes: Conjunctivae are normal.  Neck: Neck supple.  Cardiovascular: Normal rate.   Pulmonary/Chest: Effort normal. No respiratory distress.  Abdominal: Soft. She exhibits no distension.  Neurological: She is alert.  Skin: Skin is warm. Laceration noted. No rash noted.  Avulsion wound to the left thumb. Sensation is intact. Good strength against resistance. Radial pulse is 2+. See  picture.   Psychiatric: Her behavior is normal.  Nursing note and vitals reviewed.        ED Treatments / Results  DIAGNOSTIC STUDIES: Oxygen Saturation is 98% on RA, normal by my interpretation.   COORDINATION OF CARE: 2:44 PM-Discussed next steps with pt including laceration repair and pain mangement. Pt verbalized understanding and is agreeable with the plan.   Labs (all labs ordered are listed, but only abnormal results are displayed) Labs Reviewed  CBG MONITORING, ED - Abnormal; Notable for the following:       Result Value   Glucose-Capillary 130 (*)    All other components within normal limits    EKG  EKG Interpretation None       Radiology No results found.  Procedures .Marland KitchenLaceration Repair Date/Time: 06/14/2017 7:55 PM Performed by: Sherryle Lis, Lecretia Buczek A Authorized by: Joline Maxcy A   Consent:    Consent obtained:  Verbal   Consent given by:  Patient   Risks discussed:  Infection, pain, poor wound healing and vascular damage   Alternatives discussed:  No treatment Anesthesia (see MAR for exact dosages):    Anesthesia method:  Local infiltration   Local anesthetic:  Lidocaine 2% w/o epi Laceration details:    Location: left thumb.   Length (cm):  1 Pre-procedure details:    Preparation:  Patient was prepped and draped in usual sterile fashion Exploration:    Hemostasis achieved with:  Cautery and tourniquet Treatment:    Area cleansed with:  Shur-Clens and saline   Amount of cleaning:  Extensive   Irrigation solution:  Sterile water   Irrigation method:  Pressure wash Post-procedure details:    Dressing:  Antibiotic ointment, sterile dressing and adhesive bandage   Patient tolerance of procedure:  Tolerated well, no immediate complications Comments:     Patient with fingertip avulsion. Tourniquet was applied for hemostasis. After fully cleaning and irrigating the wound, cautery was required for hemostasis. No skin repair was required given the  location of the wound. A sterile dressing was placed. The patient tolerated the procedure without difficulty.   (including critical care time)  Medications Ordered in ED Medications  LORazepam (ATIVAN) tablet 0.5 mg (0.5 mg Oral Given 06/14/17 1501)  oxyCODONE-acetaminophen (PERCOCET/ROXICET) 5-325 MG per tablet 1 tablet (1 tablet Oral Given 06/14/17 1500)  lidocaine (XYLOCAINE) 2 % (with pres) injection 200 mg (200 mg Infiltration Given 06/14/17 1501)  Tdap (BOOSTRIX) injection 0.5 mL (0.5 mLs Intramuscular Given 06/14/17 1501)     Initial Impression / Assessment and Plan / ED Course  I have reviewed the triage vital signs and the nursing notes.  Pertinent labs & imaging results that were available during my care of the patient were reviewed by me and considered in my medical decision making (see chart for details).     Patient with avulsion injury to the left thumb. Tetanus was updated in ED. the patient appeared anxious and Ativan was  given. The patient's pain was also controlled in the emergency department. A digital block was successfully  performed and a tourniquet was applied for hemostasis after which several small venules were still actively bleeding and cautery was performed. The wound was extensively cleaned and irrigated; however no tissue repair was required giving the location and mechanism of injury. A sterile dressing was applied. A PCP referral was given for follow-up in 2-3 days. Strict return precautions given. No acute distress. Pt is hemodynamically stable with no complaints prior to dc.    Final Clinical Impressions(s) / ED Diagnoses   Final diagnoses:  Fingertip avulsion, initial encounter    New Prescriptions Discharge Medication List as of 06/14/2017  5:16 PM    START taking these medications   Details  oxyCODONE-acetaminophen (PERCOCET/ROXICET) 5-325 MG tablet Take 2 tablets by mouth every 4 (four) hours as needed for severe pain., Starting Thu 06/14/2017, Print         I personally performed the services described in this documentation, which was scribed in my presence. The recorded information has been reviewed and is accurate.     Joanne Gavel, PA-C 06/14/17 Tonna Boehringer, MD 06/16/17 [id number]

## 2017-06-14 NOTE — Discharge Instructions (Signed)
Please make sure to change her dressing daily or if it gets soiled. Please apply antibiotic ointment, then apply a gauze dressing, then apply as a brown Coban. You can take ibuprofen or Tylenol at home for pain control. Please call and schedule a follow [facility] with [facility] in the next 3-5 days for your wound. If the finger becomes red, swollen, or hot, please return to the emergency department for reevaluation.

## 2017-06-14 NOTE — ED Triage Notes (Signed)
Daughter/translator stated, she cut her left thumb early today at work. I think she cut the tip of her thumb. Thumb bandaged.

## 2017-06-21 NOTE — Progress Notes (Signed)
Patient ID: Faith Alvarez, female   DOB: 1971-03-08, 46 y.o.   MRN: [id number]   Faith Alvarez, is a 46 y.o. female  PXT:[id number]  OEV:[id number]  DOB - May 07, 1971  Subjective:  Chief Complaint and HPI: Faith Alvarez is a 46 y.o. female here today to establish care and for a follow up visit After being seen in the ED for fingertip avulsion on 06/14/2017 of the L thumb with a kitchen knife.  Tdap was updated.  Fingertip venules that were actively bleeding were cauterized.  The area was irrigated and sterile dressing was applied.  She went back to work the next day. Today she complains of burning/throbbing/tenderness.  No f/c.  Tylenol helps some.    [employer] interpreters Cecilia used.  Blood sugar was 130 at the hospital.  She is diabetic.  She is compliant with her medications but not with diabetic diet.  Prior to now, her diabetes and medical management has been by an office on [address] but she doesn't remember which office. She isn't sure is she knows how to use her glucometer, but she does have one.  She has been diabetic X 12 years.  ED/Hospital notes reviewed.    ROS:   Constitutional:  No f/c, No night sweats, No unexplained weight loss. EENT:  No vision changes, No blurry vision, No hearing changes. No mouth, throat, or ear problems.  Respiratory: No cough, No SOB Cardiac: No CP, no palpitations GI:  No abd pain, No N/V/D. GU: No Urinary s/sx Musculoskeletal: No joint pain Neuro: No headache, no dizziness, no motor weakness.  Skin: No rash, skin on finger improving Endocrine:  No polydipsia. No polyuria.  Psych: Denies SI/HI  No problems updated.  ALLERGIES: No Known Allergies  PAST MEDICAL HISTORY: Past Medical History:  Diagnosis Date  . Diabetes mellitus without complication ([place])     MEDICATIONS AT HOME: Prior to Admission medications   Medication Sig Start Date End Date Taking? Authorizing Provider  benzonatate (TESSALON) 100 MG capsule Take by  mouth 3 (three) times daily as needed for cough.   Yes [provider]  Blood Glucose Monitoring Suppl (ACURA BLOOD GLUCOSE METER) w/Device KIT 1 Units by Does not apply route daily. 04/01/17  Yes Arrien, Jimmy Picket, MD  fluconazole (DIFLUCAN) 150 MG tablet Take 150 mg by mouth once. Was given 2 tablets on 4/13 per pharmacy. Was to take one dose and then repeat  in 72 hours.   Yes [provider]  glipiZIDE (GLUCOTROL) 10 MG tablet Take 10 mg by mouth daily before breakfast.    Yes [provider]  loratadine (CLARITIN) 10 MG tablet Take 10 mg by mouth daily.   Yes [provider]  metFORMIN (GLUCOPHAGE) 1000 MG tablet Take 1,000 mg by mouth 2 (two) times daily with a meal.   Yes [provider]  oxyCODONE-acetaminophen (PERCOCET/ROXICET) 5-325 MG tablet Take 2 tablets by mouth every 4 (four) hours as needed for severe pain. 06/14/17  Yes McDonald, Mia A, PA-C  ibuprofen (ADVIL,MOTRIN) 600 MG tablet Take 1 tablet (600 mg total) by mouth every 8 (eight) hours as needed. 06/22/17   Argentina Donovan, PA-C     Objective:  EXAM:   Vitals:   06/22/17 1037  BP: 126/76  Pulse: 74  Resp: 18  Temp: 98.5 F (36.9 C)  TempSrc: Oral  SpO2: 100%  Weight: 128 lb (58.1 kg)  Height: 5' 2"  (1.575 m)    General appearance : A&OX3. NAD. Non-toxic-appearing HEENT: Atraumatic and  Normocephalic.  PERRLA. EOM intact.  Neck: supple, no JVD. No cervical lymphadenopathy. No thyromegaly Chest/Lungs:  Breathing-non-labored, Good air entry bilaterally, breath sounds normal without rales, rhonchi, or wheezing  CVS: S1 S2 regular, no murmurs, gallops, rubs  L hand/thumb-small avulsion ulnar aspect of distal thumb that is warm, dry, and intact without erythema or obvious swelling; healing well Extremities: Bilateral Lower Ext shows no edema, both legs are warm to touch with = pulse throughout Neurology:  CN II-XII grossly intact, Non focal.   Psych:  TP linear. J/I  WNL. Normal speech. Appropriate eye contact and affect.  Skin:  No Rash  Data Review Lab Results  Component Value Date   HGBA1C 8.7 06/22/2017   HGBA1C 10.6 (H) 04/01/2017     Assessment & Plan   1. Type 2 diabetes mellitus with complication, unspecified whether long term insulin use ([place]) Uncontrolled but improving since 04/01/2017.  Work more on diet/exercise. - Glucose (CBG) - HgB A1c  2. Avulsion of fingertip, initial encounter Keep clean and dry and covered.  No sign of infection - ibuprofen (ADVIL,MOTRIN) 600 MG tablet; Take 1 tablet (600 mg total) by mouth every 8 (eight) hours as needed.  Dispense: 30 tablet; Refill: 0  Patient have been counseled extensively about nutrition and exercise  Return in about 3 weeks (around 07/13/2017) for assign PCP; DM and follow-up finger avulsion.  The patient was given clear instructions to go to ER or return to medical center if symptoms don't improve, worsen or new problems develop. The patient verbalized understanding. The patient was told to call to get lab results if they haven't heard anything in the next week.     Freeman Caldron, PA-C [facility] [place], [place]   06/22/2017, 11:04 AM

## 2017-06-22 ENCOUNTER — Ambulatory Visit: Payer: Self-pay | Attending: Internal Medicine | Admitting: Physician Assistant

## 2017-06-22 VITALS — BP 126/76 | HR 74 | Temp 98.5°F | Resp 18 | Ht 62.0 in | Wt 128.0 lb

## 2017-06-22 DIAGNOSIS — S61112D Laceration without foreign body of left thumb with damage to nail, subsequent encounter: Secondary | ICD-10-CM | POA: Insufficient documentation

## 2017-06-22 DIAGNOSIS — Z79899 Other long term (current) drug therapy: Secondary | ICD-10-CM | POA: Insufficient documentation

## 2017-06-22 DIAGNOSIS — S61209A Unspecified open wound of unspecified finger without damage to nail, initial encounter: Secondary | ICD-10-CM

## 2017-06-22 DIAGNOSIS — Z7984 Long term (current) use of oral hypoglycemic drugs: Secondary | ICD-10-CM | POA: Insufficient documentation

## 2017-06-22 DIAGNOSIS — E118 Type 2 diabetes mellitus with unspecified complications: Secondary | ICD-10-CM | POA: Insufficient documentation

## 2017-06-22 DIAGNOSIS — W260XXD Contact with knife, subsequent encounter: Secondary | ICD-10-CM | POA: Insufficient documentation

## 2017-06-22 LAB — GLUCOSE, POCT (MANUAL RESULT ENTRY): POC GLUCOSE: 171 mg/dL — AB (ref 70–99)

## 2017-06-22 LAB — POCT GLYCOSYLATED HEMOGLOBIN (HGB A1C): HEMOGLOBIN A1C: 8.7

## 2017-06-22 MED ORDER — IBUPROFEN 600 MG PO TABS: 600.0000 mg | ORAL_TABLET | Freq: Three times a day (TID) | ORAL | 0 refills | Status: DC | PRN

## 2017-06-22 NOTE — Patient Instructions (Signed)
Check blood sugar in the mornings and at bedtime and record and bring to next visit.

## 2017-07-16 ENCOUNTER — Ambulatory Visit: Payer: Self-pay | Admitting: Licensed Clinical Social Worker

## 2017-07-16 ENCOUNTER — Ambulatory Visit: Payer: Self-pay | Attending: Family Medicine | Admitting: Family Medicine

## 2017-07-16 VITALS — BP 110/82 | HR 73 | Temp 98.2°F | Resp 18 | Ht 61.0 in | Wt 128.0 lb

## 2017-07-16 DIAGNOSIS — R200 Anesthesia of skin: Secondary | ICD-10-CM | POA: Insufficient documentation

## 2017-07-16 DIAGNOSIS — Z23 Encounter for immunization: Secondary | ICD-10-CM

## 2017-07-16 DIAGNOSIS — F419 Anxiety disorder, unspecified: Principal | ICD-10-CM

## 2017-07-16 DIAGNOSIS — E1010 Type 1 diabetes mellitus with ketoacidosis without coma: Secondary | ICD-10-CM | POA: Insufficient documentation

## 2017-07-16 DIAGNOSIS — E1165 Type 2 diabetes mellitus with hyperglycemia: Secondary | ICD-10-CM

## 2017-07-16 DIAGNOSIS — Z0000 Encounter for general adult medical examination without abnormal findings: Secondary | ICD-10-CM

## 2017-07-16 DIAGNOSIS — E1141 Type 2 diabetes mellitus with diabetic mononeuropathy: Secondary | ICD-10-CM

## 2017-07-16 DIAGNOSIS — Z79899 Other long term (current) drug therapy: Secondary | ICD-10-CM | POA: Insufficient documentation

## 2017-07-16 DIAGNOSIS — F329 Major depressive disorder, single episode, unspecified: Secondary | ICD-10-CM

## 2017-07-16 DIAGNOSIS — H547 Unspecified visual loss: Secondary | ICD-10-CM

## 2017-07-16 DIAGNOSIS — Z7984 Long term (current) use of oral hypoglycemic drugs: Secondary | ICD-10-CM | POA: Insufficient documentation

## 2017-07-16 DIAGNOSIS — S61209S Unspecified open wound of unspecified finger without damage to nail, sequela: Secondary | ICD-10-CM

## 2017-07-16 DIAGNOSIS — E1041 Type 1 diabetes mellitus with diabetic mononeuropathy: Secondary | ICD-10-CM | POA: Insufficient documentation

## 2017-07-16 DIAGNOSIS — H539 Unspecified visual disturbance: Secondary | ICD-10-CM | POA: Insufficient documentation

## 2017-07-16 LAB — POCT UA - MICROALBUMIN
CREATININE, POC: 50 mg/dL
MICROALBUMIN (UR) POC: 10 mg/L

## 2017-07-16 LAB — GLUCOSE, POCT (MANUAL RESULT ENTRY): POC Glucose: 279 mg/dl — AB (ref 70–99)

## 2017-07-16 MED ORDER — PNEUMOCOCCAL 13-VAL CONJ VACC IM SUSP: 0.5000 mL | INTRAMUSCULAR | Status: DC

## 2017-07-16 MED ORDER — AMITRIPTYLINE HCL 25 MG PO TABS: 25.0000 mg | ORAL_TABLET | Freq: Every day | ORAL | 1 refills | Status: DC

## 2017-07-16 MED ORDER — GLUCOSE BLOOD VI STRP: ORAL_STRIP | 12 refills | Status: DC

## 2017-07-16 NOTE — Progress Notes (Signed)
Patient is here for f/up   Patient has not taking her DB medication   Patient has not eaten

## 2017-07-16 NOTE — Patient Instructions (Addendum)
Schedule appointment with [facility] for Blood Glucose Monitor Apply for orange card.   Diabetes mellitus tipo2 en los adultos, cuidados personales (Type 2 Diabetes Mellitus, Self Care, Adult) Las personas que tienen diabetes tipo2 (diabetes mellitus tipo2) deben Contractor nivel de glucosa en la sangre bajo control. Es posible hacerlo a travs de lo siguiente:  Nutricin.  Actividad fsica.  Cambios en el estilo de vida.  Medicamentos o insulina, si es necesario.  El 53 de los mdicos y de Producer, television/film/video. CMO ME CONTROLO EL NIVEL DE GLUCOSA EN LA SANGRE?  Contrlese la glucemia todos los [place], con la frecuencia que le hayan indicado.  Llame al mdico si el nivel de glucosa en la sangre est por encima de las cifras ideales en 2anlisis seguidos.  Contrlese el nivel de hemoglobina A1c al [employer] al ao. Realcese controles ms frecuentes si el mdico se lo indica. El mdico fijar los objetivos del tratamiento para usted. Generalmente, los resultados de los niveles de glucosa en la sangre deben ser los siguientes:  Antes de las comidas (preprandial): de 80 a 146m/dl (4,4 a 7,266ml/l).  Despus de las comidas (posprandial): menos de 18024ml (50m91ml).  Nivel de A1c: menos del 7%. QU DEBO SABER ACERCA DE LA GLUCEMIA ALTA? El nivel alto de glucosa en la sangre se denomina hiperglucemia. Conozca los signos de la hiperglucemia. Los signos pueden incluir lo siguiente:  Sentir que tiene lo siguiente: ? Sed. ? Apetito. ? Mucho cansancio.  Necesidad de orinGarment/textile technologist mayor frecuencia que lo habitual.  Visin borrosa. QU DEBO SABER ACERCA DE LA GLUCEMIA BAJA? El nivel bajo de glucosa en la sangre se denomina hipoglucemia. Este cuadro ocurre cuando el nivel de glucosa en la sangre es igual o menor que 70mg22m(3,9mmol33m. Entre los sntomas se pueden incluir los siguientes:  Sentir que tiene lo siguiente: ? Apetito. ? Preocupacin o nervios  (ansioso). ? Sudoracin y piel hIntel Corporationnfusin. ? Mareos. ? Somnolencia. ? Nuseas.  Tener lo siguiente: ? Latidos cardacos rpidos (palpitaciones). ? Dolor de cabezaNetherlandsmbios en la visin. ? Una crisis de movimientos que no puede controlar (convulsiones). ? Pesadillas. ? Hormigueo y falta de sensibilidad (adormecimiento) alrededor de la boca, los labios o la lenguaHannahs Millicultades para hacer lo siguiente: ? Hablar. ? Prestar atencin (concentrarse). ? Moverse (coordinacin). ? Dormir.  Temblores.  Desmayos.  Molestarse con facilidad (irritabilidad). Tratamiento de la glucemia baja Para tratar la glucemia baja, ingiera un alimento o una bebida azucarada de inmediato. Si puede pensar con claridad y tragar de manera segura, siga la regla 15/15, que consiste en lo siguiente:  Consumir 15gramos de un hidrato de carbono de accin rpida. Algunos hidratos de carbono de accin rpida son los siguientes: ? 1pomo de glucosa en gel. ? 3comprimidos de azcar (comprimidos de glucosa). ? 6 a 8unidades de caramelos duros. ? 4onzas (120ml) 77mugo de frutas. ? 4onzas (120ml) d15mseosa comn (no diettica).  Contrlese la glucemia 15minuto75mspus de ingerir el hidrato de carbono.  Si el nivel de glucosa en la sangre todava es igual o menor que 70mg/dl (34mmol/l), 24miera nuevamente 15gramos de un hidrato de carbono.  Si el nivel de glucosa en la sangre no supera los 70mg/dl (3,31ml/l) des13m de 3intentos, solicite ayuda de inmediato.  Ingiera una comida o una colacin en el transcurso de 1hora despus de que la glucemia se haya normalizado. Tratamiento de la glucemia muy baja Si el nivel de glucosa en la sangre es igual o menor  que 58m/dl (329ml/l), significa que est muy bajo (hipoglucemia grave). Esto es unEngineer, maintenance (IT)No espere hasta que los sntomas desaparezcan. Solicite atencin mdica de inmediato. Comunquese con el servicio de emergencias de su  localidad (911 en los [place]). No conduzca por sus propios medios haPrincipal FinancialSi su nivel de glucosa en la sangre muy bajo y no puede ingerir ningn alimento ni bebida, tal vez deba aplicarse una inyeccin de glucagn. Un familiar o un amigo deben aprender a controlarle la glucemia y a aplicarle una inyeccin de glucagn. Pregntele al mdico si debe tener un kit de inyecciones de glucagn en su casa. QU OTRAS COSAS SON IMPORTANTES PARA CONTROLAR LA DIABETES? Medicamentos Siga estas indicaciones respecto de la insulina y los medicamentos para la diabetes:  Tmelos como se lo haya indicado el mdico.  Ajstelos como se lo haya indicado el mdico.  No se quede sin medicamentos. La diabetes puede aumentar el riesgo de tener otras enfermedades a laBarrister's clerkEstas incluyen las cardiopatas coronarias o enfermedades renales. El mdico puede recetar medicamentos para evitar los problemas causados por la diabetes. Alimento  Opte por opciones de alimentos saludables. Estos incluyen los siguientes: ? Pollo, pescado, claras de huevos y frijoles. ? Avena, salvado, trigo burgol, arroz integral, quinua y mijo. ? FrLambert Mody verduras frescas. ? Productos lcteos descremados. ? Frutos secos, aguacate, aceite de oliva y aceite de canola.  Arme un plan de alimentacin con un especialista (nutricionista).  Siga las indicaciones del mdico respecto de lo que no puede comer o beber.  Beba suficiente lquido para mantener el pis (orina) claro o de color amarillo plido.  Ingiera colaciones saludables entre comidas saludables.  Lleve un registro de los hidratos de carbono que consume. Lea las etiquetas de los alimentos. Conozca el tamao de las porciones de los alimentos.  Siga el plan para los das de enfermedad cuando no pueda comer o beber normalmente. Arme este plan con el mdico, de modo que est listo para usarlo. Actividad  Practique ejercicios al menos 3veces por semana.  No deje  pasar ms de 2das sin hacer actividad fsica.  Hable con el mdico antes de comenzar un ejercicio nuevo. Es posible que el mdico deba hacer ajustes en la inQueenslandlos medicamentos o los alimentos. Estilo de vida  No use productos que contengan tabaco. Estos incluyen cigarrillos, tabaco de maHigher education careers adviser cigarrillos electrnicos. Si necesita ayuda para dejar de fumar, consulte al mdico.  Pregntele a su mdico qu cantidad de alcohol puede tomar.  Aprenda a sobrellevar el estrs. Si necesita ayuda para lograrlo, consulte al mdMeadWestvacoCuidado del cuerpo  Mantngase al da con las vacunas.  Hgase controlar los ojos y los pies por un mdico con la frecuencia que le hayan indicado.  Revsese la piel y loConstellation BrandsExamine si hay cortes, moretones, enrojecimiento, ampollas o llagas.  Cepllese los dientes y laChalmers Use el hilo dental al menos una vez al da.  Vaya al dentista al menos una vez cada 27m69ms.  Mantenga un peso saludable. Instrucciones generales  TomDelphi venta libre y los recetados solamente como se lo haya indicado el mdico.  Comparta su plan de atencin de la diabetes con estas personas: ? Compaeros de trabajo o de la escuela. ? Aquellas con las que conHawaiian BeachesHgase anlisis de orina para detProduct manageresencia de cetonas: ? Cuando est enfermo. ? Como se lo haya indicado el mdico.  LleIllene Bolusnsigo  una tarjeta, o use un brazalete o una medalla que indiquen que es diabtico.  Pregntele al mdico lo siguiente: ? Debo reunirme con Radio broadcast assistant para el cuidado de la diabetes? ? Dnde puedo encontrar un grupo de apoyo para personas diabticas?  Concurra a todas las visitas de control como se lo haya indicado el mdico. Esto es importante. DNDE ENCONTRAR MS INFORMACIN: Para obtener ms informacin sobre la diabetes, visite los siguientes sitios:  Asociacin Americana de la Diabetes (American Diabetes  Association): www.diabetes.org  Asociacin Norteamericana de Instructores para el Cuidado de la Diabetes (American Association of Diabetes Educators): www.diabeteseducator.org/patient-resources Esta informacin no tiene Marine scientist el consejo del mdico. Asegrese de hacerle al mdico cualquier pregunta que tenga. Document Released: 03/20/2016 Document Revised: 03/20/2016 Document Reviewed: 12/31/2015 Elsevier Interactive Patient Education  2018 Belt neuroptico (Neuropathic Pain) El dolor neuroptico se produce cuando hay dao en los nervios responsables de ciertas sensaciones del cuerpo (nervios sensitivos). El dolor puede producirse por un dao en las siguientes reas:  Los nervios sensitivos que envan seales a la mdula espinal y el cerebro (sistema nervioso perifrico).  Los nervios sensitivos del cerebro o la mdula espinal (sistema nervioso central). El dolor neuroptico puede volverlo ms sensible al [employer]. Lo que sera una sensacin leve para la [employer] puede ser un dolor muy intenso, si sufre dolor neuroptico. Por lo general, es una enfermedad a largo plazo que puede ser difcil de Risk manager. El tipo de dolor puede diferir de Ardelia Mems persona a Theatre manager. Puede comenzar en forma repentina (agudo) o puede desarrollarse lentamente y durar mucho tiempo (crnico). El dolor neuroptico puede aparecer y Armed forces operational officer a medida que los nervios daados se curan, o puede permanecer estables durante aos. A menudo, provoca angustia, prdida de sueo y [place] calidad de vida inferior. CAUSAS La causa ms comn del dao en los nervios sensitivos es la diabetes. El dolor neuroptico tambin puede producirse por muchas otras enfermedades y afecciones. Las causas del dolor neuroptico pueden clasificarse como:  Txicas. Muchos medicamentos y sustancias qumicas pueden provocar dao txico. La causa ms comn del dolor neuroptico por exposicin a sustancias txicas es el dao  que produce el tratamiento Licensed conveyancer (quimioterapia).  Metablicas. Este tipo de dolor puede aparecer cuando una enfermedad provoca desequilibrios que daan los nervios. La diabetes es la ms comn de [employer]. Otra causa comn es la deficiencia de vitamina B causada por el consumo de alcohol prolongado.  Traumticas. Las lesiones que cortan, presionan o estiran un nervio pueden producir dao y Social research officer, government. Un ejemplo comn es sentir dolor despus de perder un brazo o una pierna (dolor de miembro fantasma).  Causas relacionadas con la compresin. Si un nervio sensitivo queda atrapado o comprimido por [employer] Financial, el suministro de sangre al nervio puede interrumpirse.  Vascular. Muchas enfermedades de los vasos sanguneos pueden producir dolor neuroptico al reducir el suministro de [place] y el oxgeno que Lucianne Lei a los nervios.  Autoinmune. Este tipo de Social research officer, government se produce por las [employer]on el sistema de defensa del cuerpo ataca por error los nervios sensitivos. Entre los ejemplos de enfermedades autoinmunes que pueden causar dolor neuroptico se incluyen el lupus y Curator.  Infecciosa. Muchos tipos de infecciones virales pueden daar los nervios sensitivos y Engineer, drilling. La infeccin por culebrilla (virus del herpes zster) es una causa comn de este tipo de Social research officer, government.  Hereditarias. El dolor neuroptico puede ser un sntoma de muchas enfermedades que se  transmiten [employer] de las familias (genticas). SIGNOS Y SNTOMAS El sntoma principal es Conservation officer, historic buildings. El dolor neuroptico a menudo se describe como:  Quemaduras.  Similar a un choque.  Escozor.  Fro o calor.  Picazn. DIAGNSTICO No hay un estudio que pueda diagnosticar el dolor neuroptico. El mdico le har un examen fsico y le preguntar sobre su dolor. Puede usar una escala de dolor para describir la intensidad del dolor. Tambin puede realizarle pruebas para corroborar su  sensibilidad al [employer] Corporation, y ayudar a Pension scheme manager la causa y la ubicacin de los daos en los nervios sensitivos. Estos estudios pueden incluir los siguientes:  Estudios de diagnstico por imgenes, por ejemplo: ? Radiografas. ? Tomografa computarizada. ? Resonancia magntica.  Estudios de conduccin nerviosa para evaluar si las seales nerviosas pasan por los nervios sensitivos en forma correcta o no (estudios electrodiagnsticos).  Estimulacin de los nervios sensitivos a travs de electrodos que se colocan en la piel, y la medicin de la respuesta en la mdula espinal y el cerebro (potenciales provocados somatosensitivos). TRATAMIENTO El tratamiento para el dolor neuroptico puede cambiar con el [place]. Es posible que necesite probar distintas opciones de tratamientos o una combinacin de tratamientos. Entre las opciones se incluyen las siguientes:  Analgsicos de [place].  Medicamentos recetados. Algunos medicamentos que se utilizan para tratar otras enfermedades tambin pueden ser adecuados para el dolor neuroptico. Entre ellos se incluyen los medicamentos para: ? Chief Technology Officer las convulsiones (anticonvulsivos). ? Aliviar la depresin (antidepresivos).  Analgsicos que requieren receta mdica (narcticos). Por lo general, estos medicamentos se utilizan cuando otros analgsicos no son eficaces.  Neuroestimulacin transcutnea (TENS). En este tratamiento se utilizan corrientes elctricas para bloquear las seales nerviosas dolorosas. El tratamiento es indoloro.  Anestsicos tpicos y locales. Estos son medicamentos que Te[employer] Corporation nervios. Pueden inyectarse como bloqueos nerviosos o aplicarse en la piel.  Tratamientos alternativos, como: ? Acupuntura. ? Meditacin. ? Masajes. ? Fisioterapia. ? Programas para el control del dolor. ? Psicoterapia. INSTRUCCIONES PARA EL CUIDADO EN EL HOGAR  Infrmese todo lo que pueda sobre su enfermedad.  Tome los medicamentos solamente como se  lo haya indicado el mdico.  Trabaje en estrecha colaboracin con todos los mdicos para hallar el tratamiento ms adecuado para usted.  Tenga un buen sistema de Teaching laboratory technician.  Considere la opcin de unirse a un grupo de apoyo para el dolor crnico. SOLICITE ATENCIN MDICA SI:  Sus tratamientos para Conservation officer, historic buildings no son eficaces.  Los SPX Corporation causan Om[employer]are.  Est lidiando con sntomas de fatiga, cambios en el estado de nimo, depresin o ansiedad. Esta informacin no tiene Marine scientist el consejo del mdico. Asegrese de hacerle al mdico cualquier pregunta que tenga. Document Released: 03/05/2008 Document Revised: 12/18/2014 Document Reviewed: 05/07/2014 Elsevier Interactive Patient Education  [employer].

## 2017-07-16 NOTE — BH Specialist Note (Signed)
Integrated Behavioral Health Initial Visit  MRN: [id number] Name: Faith Alvarez   Session Start time: 9:15 AM Session End time: 9:50 AM Total time: 35 minutes  Type of Service: Integrated Behavioral Health- Individual/Family Interpretor:Yes.   Interpretor Name and Language: Cleotis NipperLucia [id number](ID#[id number]) Spanish   Warm Hand Off Completed.       SUBJECTIVE: Faith Alvarez is a 46 y.o. female accompanied by patient and minor daughter. Patient was referred by FNP Hairston for anxiety and depression. Patient reports the following symptoms/concerns: overwhelming feelings of sadness and worry, difficulty sleeping, low energy, difficulty concentrating, restlessness, and irritability Duration of problem: Couple of years; Severity of problem: moderate  OBJECTIVE: Mood: Anxious and Affect: Appropriate Risk of harm to self or others: No plan to harm self or others   LIFE CONTEXT: Family and Social: Pt receives support from spouse, brothers, and sisters.  School/Work: Pt is employed full-time Self-Care: Pt is open to medication management and is open to psychotherapy  Life Changes: Pt is experiencing increased anxiety due to parents' declining health.   GOALS ADDRESSED: Patient will reduce symptoms of: anxiety and depression and increase knowledge and/or ability of: coping skills and also: Increase adequate support systems for patient/family   INTERVENTIONS: Solution-Focused Strategies, Supportive Counseling, Psychoeducation and/or Health Education and Link to WalgreenCommunity Resources  Standardized Assessments completed: GAD-7 and PHQ 2&9  ASSESSMENT: Patient currently experiencing depression and anxiety triggered by parents' declining health. She reports overwhelming feelings of sadness and worry, difficulty sleeping, low energy, difficulty concentrating, restlessness, and irritability. Patient receives emotional support from family. Patient may benefit from psychotherapy, psychoeducation, and  medication management. LCSWA educated pt on how stress can negatively impact one's mental and physical health. LCSWA discussed the benefits of applying healthy coping skills to decrease symptoms. Pt successfully identified healthy coping strategies to utilize on a routine basis. She is open to medication management and psychotherapy. LCSWA provided resources on caregiver support groups, psychotherapy, and encouraged pt to complete financial counseling to assist with financial strain.   PLAN: 1. Follow up with behavioral health clinician on : Pt was encouraged to contact LCSWA if symptoms worsen or fail to improve [facility] 2. Behavioral recommendations: LCSWA recommends that pt apply healthy coping skills discussed and utilize provided resources. Pt is encouraged to schedule follow up appointment with LCSWA 3. Referral(s): Integrated Art gallery managerBehavioral Health Services (In Clinic), Community Mental Health Services (LME/Outside Clinic) and Community Resources:  Finances 4. "From scale of 1-10, how likely are you to follow plan?": 8/10  Bridgett LarssonJasmine D Kamren Heintzelman, LCSW 07/17/17 9:19 AM
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
## 2017-07-17 LAB — LIPID PANEL
CHOL/HDL RATIO: 5.2 ratio — AB (ref 0.0–4.4)
Cholesterol, Total: 260 mg/dL — ABNORMAL HIGH (ref 100–199)
HDL: 50 mg/dL (ref >39)
LDL Calculated: 146 mg/dL — ABNORMAL HIGH (ref 0–99)
Triglycerides: 318 mg/dL — ABNORMAL HIGH (ref 0–149)
VLDL Cholesterol Cal: 64 mg/dL — ABNORMAL HIGH (ref 5–40)

## 2017-07-25 ENCOUNTER — Telehealth: Payer: Self-pay

## 2017-07-25 ENCOUNTER — Other Ambulatory Visit: Payer: Self-pay | Admitting: Family Medicine

## 2017-07-25 DIAGNOSIS — E782 Mixed hyperlipidemia: Secondary | ICD-10-CM

## 2017-07-25 MED ORDER — PRAVASTATIN SODIUM 20 MG PO TABS: 20.0000 mg | ORAL_TABLET | Freq: Every day | ORAL | 2 refills | Status: DC

## 2017-07-25 NOTE — Telephone Encounter (Signed)
CMA call regarding lab results   Patient Verify DOB   Patient was aware and understood  

## 2017-07-25 NOTE — Telephone Encounter (Signed)
-----   Message from Lizbeth BarkMandesia R Hairston, FNP sent at 07/25/2017  8:58 AM EDT ----- -Lipid levels were elevated. This can increase your risk of heart disease. You will be prescribed pravastatin to help lower risk. - Start eating a diet low in saturated fat. Limit your intake of fried foods, red meats, and whole milk. Begin exercising at least 3-5 times per week for 30 minutes. Recommend follow up in 3 months.

## 2017-07-30 ENCOUNTER — Other Ambulatory Visit: Payer: Self-pay | Admitting: Family Medicine

## 2017-09-04 ENCOUNTER — Other Ambulatory Visit: Payer: Self-pay | Admitting: Family Medicine

## 2017-09-04 ENCOUNTER — Telehealth: Payer: Self-pay | Admitting: Family Medicine

## 2017-09-04 DIAGNOSIS — IMO0002 Reserved for concepts with insufficient information to code with codable children: Secondary | ICD-10-CM | POA: Insufficient documentation

## 2017-09-04 DIAGNOSIS — E1165 Type 2 diabetes mellitus with hyperglycemia: Secondary | ICD-10-CM

## 2017-09-04 MED ORDER — GLUCOSE BLOOD VI STRP: ORAL_STRIP | 12 refills | Status: DC

## 2017-09-04 MED ORDER — TRUEPLUS LANCETS 28G MISC: 1.0000 | Freq: Once | 12 refills | Status: AC

## 2017-09-04 MED ORDER — TRUE METRIX METER W/DEVICE KIT: 1.0000 | PACK | Freq: Once | 0 refills | Status: AC

## 2017-09-04 NOTE — Telephone Encounter (Signed)
Pt. Came to facility requesting an Rx for a meter. Pt. States that her PCP wanted her to check her sugar but does not have a meter. Pt uses [facility] pharmacy. Please advice?

## 2017-09-04 NOTE — Telephone Encounter (Signed)
Pt. Came to facility requesting an Rx for a meter. Pt. States that her PCP wanted her to check her sugar but does not have a meter. Pt uses [facility] pharmacy. Please f/u

## 2017-09-04 NOTE — Telephone Encounter (Signed)
Patient was asked at last office visit if she had glucometer available to check blood glucose and she said yes. Blood Glucose Monitoring Suppl (ACURA BLOOD GLUCOSE METER) w/Device KIT is also listed on her current medication list. However will re-order supplies for patient and will sent them to [facility] pharmacy.

## 2017-09-21 ENCOUNTER — Other Ambulatory Visit: Payer: Self-pay | Admitting: Pharmacist

## 2017-09-21 MED ORDER — GLUCOSE BLOOD VI STRP: ORAL_STRIP | 12 refills | Status: DC

## 2017-09-21 MED ORDER — TRUE METRIX METER W/DEVICE KIT: PACK | 0 refills | Status: DC

## 2017-09-21 MED ORDER — TRUEPLUS LANCETS 28G MISC: 12 refills | Status: DC

## 2017-09-25 ENCOUNTER — Encounter: Payer: Self-pay | Admitting: Pharmacist

## 2017-10-03 ENCOUNTER — Ambulatory Visit: Payer: Self-pay

## 2017-10-03 ENCOUNTER — Ambulatory Visit: Payer: Self-pay | Attending: Internal Medicine | Admitting: Physician Assistant

## 2017-10-03 ENCOUNTER — Encounter: Payer: Self-pay | Admitting: Physician Assistant

## 2017-10-03 VITALS — BP 123/85 | HR 65 | Temp 98.1°F | Resp 18 | Ht 62.0 in | Wt 127.2 lb

## 2017-10-03 DIAGNOSIS — X58XXXA Exposure to other specified factors, initial encounter: Secondary | ICD-10-CM | POA: Insufficient documentation

## 2017-10-03 DIAGNOSIS — Z7984 Long term (current) use of oral hypoglycemic drugs: Secondary | ICD-10-CM | POA: Insufficient documentation

## 2017-10-03 DIAGNOSIS — E782 Mixed hyperlipidemia: Secondary | ICD-10-CM | POA: Insufficient documentation

## 2017-10-03 DIAGNOSIS — E1165 Type 2 diabetes mellitus with hyperglycemia: Secondary | ICD-10-CM | POA: Insufficient documentation

## 2017-10-03 DIAGNOSIS — S025XXB Fracture of tooth (traumatic), initial encounter for open fracture: Secondary | ICD-10-CM

## 2017-10-03 DIAGNOSIS — F4321 Adjustment disorder with depressed mood: Secondary | ICD-10-CM

## 2017-10-03 DIAGNOSIS — S025XXA Fracture of tooth (traumatic), initial encounter for closed fracture: Secondary | ICD-10-CM | POA: Insufficient documentation

## 2017-10-03 DIAGNOSIS — Z79891 Long term (current) use of opiate analgesic: Secondary | ICD-10-CM | POA: Insufficient documentation

## 2017-10-03 DIAGNOSIS — Z79899 Other long term (current) drug therapy: Secondary | ICD-10-CM | POA: Insufficient documentation

## 2017-10-03 DIAGNOSIS — F4322 Adjustment disorder with anxiety: Secondary | ICD-10-CM | POA: Insufficient documentation

## 2017-10-03 DIAGNOSIS — Z791 Long term (current) use of non-steroidal anti-inflammatories (NSAID): Secondary | ICD-10-CM | POA: Insufficient documentation

## 2017-10-03 LAB — GLUCOSE, POCT (MANUAL RESULT ENTRY): POC Glucose: 221 mg/dl — AB (ref 70–99)

## 2017-10-03 MED ORDER — PRAVASTATIN SODIUM 20 MG PO TABS: 20.0000 mg | ORAL_TABLET | Freq: Every day | ORAL | 2 refills | Status: DC

## 2017-10-03 MED ORDER — METFORMIN HCL 1000 MG PO TABS: 1000.0000 mg | ORAL_TABLET | Freq: Two times a day (BID) | ORAL | 2 refills | Status: DC

## 2017-10-03 MED ORDER — GLIPIZIDE 10 MG PO TABS: 10.0000 mg | ORAL_TABLET | Freq: Every day | ORAL | 1 refills | Status: DC

## 2017-10-03 MED ORDER — HYDROXYZINE PAMOATE 25 MG PO CAPS: 25.0000 mg | ORAL_CAPSULE | Freq: Three times a day (TID) | ORAL | 0 refills | Status: DC | PRN

## 2017-10-03 NOTE — Progress Notes (Signed)
Patient ID: Faith Alvarez, female   DOB: May 16, 1971, 46 y.o.   MRN: [id number]   Faith Alvarez, is a 46 y.o. female  XYI:[id number]  [id number]:[id number]  DOB - 06/29/1971  Subjective:  Chief Complaint and HPI: Faith Alvarez is a 46 y.o. female here today for medication RF.  She has not taken her medications in about 3 months since the death of her father.  She has met with Christa See since then.  She has been feeling very anxious.  Also taking care of her mother now.  Doesn't have a lot of family support.  She does have some friends she can lean on.  She would like something to take for anxiety.  She denies SI/HI.  She hasn't been checking her blood sugars. Wants to get back on her meds and start taking better care of herself again.     +broken tooth a few weeks ago.  Doesn't think she has ever seen a dentist.  ROS:   Constitutional:  No f/c, No night sweats, No unexplained weight loss. EENT:  No vision changes, No blurry vision, No hearing changes. No mouth, throat, or ear problems.  Respiratory: No cough, No SOB Cardiac: No CP, no palpitations GI: + abd pain, No N/V/D. GU: No Urinary s/sx Musculoskeletal: No joint pain Neuro: No headache, no dizziness, no motor weakness.  Skin: No rash Endocrine:  No polydipsia. No polyuria.  Psych: Denies SI/HI  Depression screen [facility] 2/9 10/03/2017 07/16/2017  Decreased Interest 2 1  Down, Depressed, Hopeless 1 3  PHQ - 2 Score 3 4  Altered sleeping 1 1  Tired, decreased energy 1 2  Change in appetite 2 1  Feeling bad or failure about yourself  2 2  Trouble concentrating 3 1  Moving slowly or fidgety/restless 0 -  Suicidal thoughts 0 0  PHQ-9 Score 12 11    GAD 7 : Generalized Anxiety Score 10/03/2017 07/16/2017  Nervous, Anxious, on Edge 1 3  Control/stop worrying 1 3  Worry too much - different things 1 3  Trouble relaxing 3 3  Restless 1 3  Easily annoyed or irritable 2 3  Afraid - awful might happen 1 0  Total GAD 7 Score 10  18     No problems updated.  ALLERGIES: No Known Allergies  PAST MEDICAL HISTORY: Past Medical History:  Diagnosis Date  . Diabetes mellitus without complication ([place])     MEDICATIONS AT HOME: Prior to Admission medications   Medication Sig Start Date End Date Taking? Authorizing Provider  amitriptyline (ELAVIL) 25 MG tablet Take 1 tablet (25 mg total) by mouth at bedtime. 07/16/17   Alfonse Spruce, FNP  benzonatate (TESSALON) 100 MG capsule Take by mouth 3 (three) times daily as needed for cough.    [provider]  Blood Glucose Monitoring Suppl (TRUE METRIX METER) w/Device KIT Use as directed 09/21/17   Alfonse Spruce, FNP  fluconazole (DIFLUCAN) 150 MG tablet Take 150 mg by mouth once. Was given 2 tablets on 4/13 per pharmacy. Was to take one dose and then repeat  in 72 hours.    [provider]  glipiZIDE (GLUCOTROL) 10 MG tablet Take 1 tablet (10 mg total) by mouth daily before breakfast. 10/03/17   Thereasa Solo, Dionne Bucy, PA-C  glucose blood (TRUE METRIX BLOOD GLUCOSE TEST) test strip Use as instructed 09/21/17   Alfonse Spruce, FNP  hydrOXYzine (VISTARIL) 25 MG capsule Take 1 capsule (25 mg total) by mouth 3 (three) times daily as  needed. Prn anxiety 10/03/17   Argentina Donovan, PA-C  ibuprofen (ADVIL,MOTRIN) 600 MG tablet Take 1 tablet (600 mg total) by mouth every 8 (eight) hours as needed. 06/22/17   Argentina Donovan, PA-C  loratadine (CLARITIN) 10 MG tablet Take 10 mg by mouth daily.    [provider]  metFORMIN (GLUCOPHAGE) 1000 MG tablet Take 1 tablet (1,000 mg total) by mouth 2 (two) times daily with a meal. 10/03/17   McClung, Dionne Bucy, PA-C  oxyCODONE-acetaminophen (PERCOCET/ROXICET) 5-325 MG tablet Take 2 tablets by mouth every 4 (four) hours as needed for severe pain. 06/14/17   McDonald, Mia A, PA-C  pravastatin (PRAVACHOL) 20 MG tablet Take 1 tablet (20 mg total) by mouth daily. 10/03/17   Argentina Donovan, PA-C  TRUEPLUS  LANCETS 28G MISC Use as directed 09/21/17   Alfonse Spruce, FNP     Objective:  EXAM:   Vitals:   10/03/17 1529  BP: 123/85  Pulse: 65  Resp: 18  Temp: 98.1 F (36.7 C)  TempSrc: Oral  SpO2: 99%  Weight: 127 lb 3.2 oz (57.7 kg)  Height: 5' 2"  (1.575 m)    General appearance : A&OX3. NAD. Non-toxic-appearing HEENT: Atraumatic and Normocephalic.  PERRLA. EOM intact. Broken tooth upper left mandible.  No obvious infection. Neck: supple, no JVD. No cervical lymphadenopathy. No thyromegaly Chest/Lungs:  Breathing-non-labored, Good air entry bilaterally, breath sounds normal without rales, rhonchi, or wheezing  CVS: S1 S2 regular, no murmurs, gallops, rubs  Extremities: Bilateral Lower Ext shows no edema, both legs are warm to touch with = pulse throughout Neurology:  CN II-XII grossly intact, Non focal.   Psych:  TP linear. J/I WNL. Normal speech. Appropriate eye contact and affect.  Skin:  No Rash  Data Review Lab Results  Component Value Date   HGBA1C 8.7 06/22/2017   HGBA1C 10.6 (H) 04/01/2017     Assessment & Plan   1. Uncontrolled type 2 diabetes mellitus with hyperglycemia (HCC) Uncontrolled and has been off all meds.  - Glucose (CBG) Resume and titrate to full dose over 2 weeks- metFORMIN (GLUCOPHAGE) 1000 MG tablet; Take 1 tablet (1,000 mg total) by mouth 2 (two) times daily with a meal.  Dispense: 60 tablet; Refill: 2 - glipiZIDE (GLUCOTROL) 10 MG tablet; Take 1 tablet (10 mg total) by mouth daily before breakfast.  Dispense: 60 tablet; Refill: 1 Check blood sugars bid and record  2. Mixed hyperlipidemia resume - pravastatin (PRAVACHOL) 20 MG tablet; Take 1 tablet (20 mg total) by mouth daily.  Dispense: 30 tablet; Refill: 2  3. Grief reaction Advised to schedule an appt with Christa See.  - hydrOXYzine (VISTARIL) 25 MG capsule; Take 1 capsule (25 mg total) by mouth 3 (three) times daily as needed. Prn anxiety  Dispense: 30 capsule; Refill: 0 -  Ambulatory referral to Psychology Counseled on self-care, hospice for grief counseling, other spiritual support.    4. Open fracture of tooth, initial encounter - Ambulatory referral to Dentistry  Spent >40 mins face to face with over half the time with counseling.    Patient have been counseled extensively about nutrition and exercise  Return in about 2 months (around 12/03/2017) for [facility] for DM and pap smear.  The patient was given clear instructions to go to ER or return to medical center if symptoms don't improve, worsen or new problems develop. The patient verbalized understanding. The patient was told to call to get lab results if they haven't heard anything in the  next week.     Freeman Caldron, PA-C [facility] and [facility], [place]   10/03/2017, 3:46 PM

## 2017-10-03 NOTE — Patient Instructions (Signed)
When restarting the metformin, take 1 daily for the first week then increase dose to twice daily

## 2017-10-03 NOTE — Progress Notes (Signed)
JA 

## 2017-10-22 ENCOUNTER — Encounter: Payer: Self-pay | Admitting: Family Medicine

## 2017-10-22 ENCOUNTER — Ambulatory Visit: Payer: Self-pay | Attending: Family Medicine | Admitting: Family Medicine

## 2017-10-22 VITALS — BP 110/67 | HR 74 | Temp 98.5°F | Resp 18 | Ht 62.0 in | Wt 124.6 lb

## 2017-10-22 DIAGNOSIS — Z7984 Long term (current) use of oral hypoglycemic drugs: Secondary | ICD-10-CM | POA: Insufficient documentation

## 2017-10-22 DIAGNOSIS — F4321 Adjustment disorder with depressed mood: Secondary | ICD-10-CM

## 2017-10-22 DIAGNOSIS — E1165 Type 2 diabetes mellitus with hyperglycemia: Secondary | ICD-10-CM | POA: Insufficient documentation

## 2017-10-22 DIAGNOSIS — Z113 Encounter for screening for infections with a predominantly sexual mode of transmission: Secondary | ICD-10-CM | POA: Insufficient documentation

## 2017-10-22 DIAGNOSIS — F4320 Adjustment disorder, unspecified: Secondary | ICD-10-CM | POA: Insufficient documentation

## 2017-10-22 DIAGNOSIS — Z23 Encounter for immunization: Secondary | ICD-10-CM | POA: Insufficient documentation

## 2017-10-22 DIAGNOSIS — E1141 Type 2 diabetes mellitus with diabetic mononeuropathy: Secondary | ICD-10-CM | POA: Insufficient documentation

## 2017-10-22 DIAGNOSIS — Z01419 Encounter for gynecological examination (general) (routine) without abnormal findings: Secondary | ICD-10-CM | POA: Insufficient documentation

## 2017-10-22 DIAGNOSIS — F4322 Adjustment disorder with anxiety: Secondary | ICD-10-CM | POA: Insufficient documentation

## 2017-10-22 DIAGNOSIS — F419 Anxiety disorder, unspecified: Secondary | ICD-10-CM

## 2017-10-22 DIAGNOSIS — Z79899 Other long term (current) drug therapy: Secondary | ICD-10-CM | POA: Insufficient documentation

## 2017-10-22 DIAGNOSIS — Z760 Encounter for issue of repeat prescription: Secondary | ICD-10-CM | POA: Insufficient documentation

## 2017-10-22 LAB — GLUCOSE, POCT (MANUAL RESULT ENTRY)
POC GLUCOSE: 330 mg/dL — AB (ref 70–99)
POC Glucose: 208 mg/dl — AB (ref 70–99)

## 2017-10-22 LAB — POCT GLYCOSYLATED HEMOGLOBIN (HGB A1C): Hemoglobin A1C: 10.7

## 2017-10-22 MED ORDER — TRUE METRIX METER W/DEVICE KIT: PACK | 0 refills | Status: DC

## 2017-10-22 MED ORDER — HYDROXYZINE PAMOATE 25 MG PO CAPS: 25.0000 mg | ORAL_CAPSULE | Freq: Three times a day (TID) | ORAL | 0 refills | Status: DC | PRN

## 2017-10-22 MED ORDER — GLIPIZIDE 10 MG PO TABS: 10.0000 mg | ORAL_TABLET | Freq: Every day | ORAL | 2 refills | Status: DC

## 2017-10-22 MED ORDER — PRAVASTATIN SODIUM 20 MG PO TABS
20.0000 mg | ORAL_TABLET | Freq: Every day | ORAL | 5 refills | Status: DC
Start: 2017-10-22 — End: 2018-04-17

## 2017-10-22 MED ORDER — GLUCOSE BLOOD VI STRP: ORAL_STRIP | 12 refills | Status: DC

## 2017-10-22 MED ORDER — AMITRIPTYLINE HCL 25 MG PO TABS: 25.0000 mg | ORAL_TABLET | Freq: Every day | ORAL | 5 refills | Status: DC

## 2017-10-22 MED ORDER — TRUEPLUS LANCETS 28G MISC: 12 refills | Status: DC

## 2017-10-22 MED ORDER — METFORMIN HCL 1000 MG PO TABS: 1000.0000 mg | ORAL_TABLET | Freq: Two times a day (BID) | ORAL | 6 refills | Status: DC

## 2017-10-22 MED ORDER — INSULIN ASPART 100 UNIT/ML ~~LOC~~ SOLN
10.0000 [IU] | Freq: Once | SUBCUTANEOUS | Status: AC
  Administered 2017-10-22: 10 [IU] via SUBCUTANEOUS

## 2017-10-22 NOTE — Patient Instructions (Signed)
Autoexamen de mamas (Breast Self-Awareness) El autoexamen de mamas significa lo siguiente:  Saber cul es el aspecto de las mamas.  Saber cmo se las siente al tacto.  Controlarse las mamas mensualmente para detectar cambios.  Informar el mdico si advierte un cambio en las mamas. El autoexamen de mama le permite advertir problemas en la mama con anticipacin, cuando todava son pequeos. CMO REALIZAR EL AUTOEXAMEN DE MAMAS Una forma de aprender qu es normal para las mamas y revisar si hay cambios es hacerse un autoexamen de las mamas. Para hacer un autoexamen de las mamas: Busque cambios 1. Qutese toda la ropa por encima de la cintura. 2. Prese frente a un espejo en una habitacin con buena iluminacin. 3. Apoye las manos en las caderas. 4. Empuje hacia abajo con las manos. 5. Mrese las mamas y los pezones en el espejo, para ver si hay diferencias entre s. Durante el examen, intente determinar si:  La forma de una mama es diferente.  El tamao de una mama es diferente.  Hay arrugas, depresiones y protuberancias en una mama y no en la otra. 1. Observe cada mama para buscar cambios en la piel, por ejemplo:  Enrojecimiento.  Zonas escamosas. 1. Observe si hay cambios en los pezones, por ejemplo:  Lquido alrededor de los pezones.  Hemorragia.  Hoyuelos.  Enrojecimiento.  Un cambio en el lugar de los pezones. Plpese para detectar si hay cambios 1. Acustese en el piso boca arriba. 2. Plpese cada mama. Para hacerlo, siga estos pasos:  Elija una mama para palpar.  Coloque el brazo ms cercano a esa mama por encima de la cabeza.  Use el otro brazo para palpar la zona del pezn de la mama. Plpese la zona con las yemas de los tres dedos del medio y haga crculos con los dedos. Con el primer crculo, presione suavemente. Con el segundo, ms fuerte. Con el tercero, an ms fuerte.  Siga haciendo crculos con los dedos con la presin suave, fuerte e incluso ms  fuerte a medida que desciende por la mama. Detngase cuando sienta las costillas.  Desplace los dedos un poco hacia el centro del cuerpo.  Empiece a hacer crculos con los dedos nuevamente y esta vez haga movimientos ascendentes hasta llegar a la clavcula.  Siga haciendo crculos hacia arriba y hacia abajo hasta llegar a la axila. Recuerde hacerlos con las tres presiones.  Plpese la otra mama de la misma forma. 1. Sintese o prese en la ducha o la baera. 2. Con agua jabonosa en la piel, plpese cada mama del mismo modo que lo hizo en el paso2, mientras estaba acostada en el piso. Anote sus hallazgos Despus del autoexamen, anote lo siguiente:  Qu es normal para cada mama.  Cualquier cambio que haya encontrado en cada mama.  Cundo tuvo la ltima menstruacin. CON QU FRECUENCIA DEBO EXAMINARME LAS MAMAS? Examnese las mamas todos los meses. Si est amamantando, el mejor momento para el examen es despus de darle de mamar al beb o despus de usar un sacaleches. Si menstra, el mejor momento para hacerlo es 5 a 7das despus de la finalizada la menstruacin. CUNDO DEBO VISITAR AL MDICO? Visite al mdico si advierte lo siguiente:  Un cambio en la forma o el tamao de las mamas o los pezones.  Un cambio en la piel de las mamas o los pezones, como la piel enrojecida o escamosa.  Secrecin de un lquido fuera de lo comn de los pezones.  Un ndulo o una   zona engrosada que no tena antes.  Dolor de mamas.  Cualquier cosa que la preocupe. Esta informacin no tiene como fin reemplazar el consejo del mdico. Asegrese de hacerle al mdico cualquier pregunta que tenga. Document Released: 12/30/2010 Document Revised: 03/20/2016 Document Reviewed: 10/17/2015 Elsevier Interactive Patient Education  2018 Elsevier Inc.  Prueba de Papanicolaou (Pap Test) POR QU ME DEBO REALIZAR ESTA PRUEBA? A esta prueba tambin se la denomina "frotis de Pap". Es una prueba de deteccin que se  utiliza para detectar signos de cncer de vagina, cuello del tero y tero. La prueba tambin puede identificar la presencia de infeccin o cambios precancerosos. El mdico probablemente le recomiende que se realice esta prueba en forma regular. Esta prueba puede realizarse de la siguiente manera:  Cada 3 aos, a partir de los 21 aos.  Cada 5 aos, en combinacin con las pruebas que se realizan para detectar la presencia del virus del papiloma humano (VPH).  Con mayor o menor frecuencia, en funcin de otras enfermedades que tenga. QU TIPO DE MUESTRA SE TOMA? El mdico utilizar un pequeo hisopo de algodn, una esptula de plstico o un cepillo para recolectar una muestra de clulas de la superficie del cuello del tero. El cuello del tero es la apertura del tero, que tambin se conoce como matriz. Tambin pueden recolectarse las secreciones del cuello del tero y la vagina. CMO DEBO PREPARARME PARA ESTA PRUEBA?  Tenga en cuenta en qu etapa del ciclo menstrual se encuentra. Es posible que deba reprogramar la prueba si est menstruando el da en que debe realizrsela.  Si el da en que debe realizarse la prueba tiene una infeccin vaginal aparente, deber reprogramar la prueba.  Pueden pedirle que evite tomar una ducha o bao el da de la prueba o el da anterior.  Algunos medicamentos pueden provocar resultados anormales de la prueba, como los digitlicos y la tetraciclina. Si toma alguno de estos medicamentos, hable con su mdico antes de realizarse la prueba. QU SIGNIFICAN LOS RESULTADOS? Los resultados anormales de la prueba pueden indicar diversas enfermedades. Estas pueden incluir lo siguiente:  Cncer. Si bien los resultados de la prueba de Papanicolaou no pueden utilizarse para diagnosticar cncer de cuello del tero, de vagina o de tero, pueden indicar que existe una posibilidad de presencia de cncer. En este caso, ser necesario realizar pruebas adicionales para determinar  la presencia de cncer.  Enfermedad de transmisin sexual.  Infecciones por hongos.  Infeccin por parsitos.  Infeccin por herpes.  Una enfermedad que causa o favorece la infertilidad. Es su responsabilidad retirar el resultado del estudio. Consulte en el laboratorio o en el departamento en el que fue realizado el estudio cundo y cmo podr obtener los resultados. Comunquese con el mdico si tiene preguntas sobre los resultados. Esta informacin no tiene como fin reemplazar el consejo del mdico. Asegrese de hacerle al mdico cualquier pregunta que tenga. Document Released: 05/15/2008 Document Revised: 12/18/2014 Document Reviewed: 04/20/2014 Elsevier Interactive Patient Education  2018 Elsevier Inc.  

## 2017-10-22 NOTE — Progress Notes (Signed)
Subjective:  Patient ID: Faith Alvarez, female    DOB: 09-22-1971  Age: 46 y.o. MRN: [id number]  CC: Gynecologic Exam   HPI Faith Alvarez presents for well woman visit with pap. She reports family history of breast or gynecological cancers. She denies any lumps, denting or dimpling of the breast, or nipple discharge. She does not perform SBE. She denies any vaginal  discharge, or dysuria. She does report vaginal itching, bleeding. She is agreeable to STI testing with pap.  History of DM. Symptoms: visual disturbances greater than 3 months. She denies having opthalmologic exam within the last year. Symptoms have remained unchanged.   Patient denies foot ulcerations, paresthesias, nausea, polydipsia, polyuria and vomiting.  Evaluation to date has been included: hemoglobin A1C, fasting lipid, fasting blood sugars.  Home sugars: patient does not check sugars. She reports losing her glucometer recently and requests another. Treatment to date: metformin and sulfonylurea. She appears to express anxious mood. She reports recent stressors including caring for her ill mother and recent death of her father. She is tearful. She denies current suicidal and homicidal ideation. She declines to speaking with LCSW at this time, but is agreeable to receiving information handouts on available counseling resources.  Outpatient Medications Prior to Visit  Medication Sig Dispense Refill  . ibuprofen (ADVIL,MOTRIN) 600 MG tablet Take 1 tablet (600 mg total) by mouth every 8 (eight) hours as needed. 30 tablet 0  . loratadine (CLARITIN) 10 MG tablet Take 10 mg by mouth daily.    Marland Kitchen amitriptyline (ELAVIL) 25 MG tablet Take 1 tablet (25 mg total) by mouth at bedtime. 30 tablet 1  . benzonatate (TESSALON) 100 MG capsule Take by mouth 3 (three) times daily as needed for cough.    . Blood Glucose Monitoring Suppl (TRUE METRIX METER) w/Device KIT Use as directed 1 kit 0  . fluconazole (DIFLUCAN) 150 MG tablet Take 150 mg  by mouth once. Was given 2 tablets on 4/13 per pharmacy. Was to take one dose and then repeat  in 72 hours.    Marland Kitchen glipiZIDE (GLUCOTROL) 10 MG tablet Take 1 tablet (10 mg total) by mouth daily before breakfast. 60 tablet 1  . glucose blood (TRUE METRIX BLOOD GLUCOSE TEST) test strip Use as instructed 100 each 12  . hydrOXYzine (VISTARIL) 25 MG capsule Take 1 capsule (25 mg total) by mouth 3 (three) times daily as needed. Prn anxiety 30 capsule 0  . metFORMIN (GLUCOPHAGE) 1000 MG tablet Take 1 tablet (1,000 mg total) by mouth 2 (two) times daily with a meal. 60 tablet 2  . oxyCODONE-acetaminophen (PERCOCET/ROXICET) 5-325 MG tablet Take 2 tablets by mouth every 4 (four) hours as needed for severe pain. 5 tablet 0  . pravastatin (PRAVACHOL) 20 MG tablet Take 1 tablet (20 mg total) by mouth daily. 30 tablet 2  . TRUEPLUS LANCETS 28G MISC Use as directed 100 each 12   No facility-administered medications prior to visit.     ROS Review of Systems  Constitutional: Negative.   Eyes: Positive for visual disturbance.  Respiratory: Negative.   Cardiovascular: Negative.   Gastrointestinal: Negative.   Genitourinary:       Vaginal dryness  Musculoskeletal: Negative.   Psychiatric/Behavioral: Negative for suicidal ideas. The patient is nervous/anxious.    Objective:  BP 110/67 (BP Location: Left Arm, Patient Position: Sitting, Cuff Size: Normal)   Pulse 74   Temp 98.5 F (36.9 C) (Oral)   Resp 18   Ht 5' 2"  (1.575 m)  Wt 124 lb 9.6 oz (56.5 kg)   SpO2 98%   BMI 22.79 kg/m   BP/Weight 10/22/2017 38/75/6433 01/19/5187  Systolic BP 416 606 301  Diastolic BP 67 85 82  Wt. (Lbs) 124.6 127.2 128  BMI 22.79 23.27 24.19     Physical Exam  Constitutional: She appears well-developed and well-nourished.  Cardiovascular: Normal rate, regular rhythm, normal heart sounds and intact distal pulses.  Pulmonary/Chest: Effort normal and breath sounds normal. Right breast exhibits no mass, no nipple  discharge, no skin change and no tenderness. Left breast exhibits no mass, no nipple discharge, no skin change and no tenderness.  Abdominal: Soft. Bowel sounds are normal.  Genitourinary: Cervix exhibits discharge (scant brown).  Genitourinary Comments: Maceration of external bilateral  labia majora.   Skin: Skin is warm and dry.  Nursing note and vitals reviewed.    Assessment & Plan:   1. Well woman exam with routine gynecological exam - Cytology - PAP [place] - Cervicovaginal ancillary only Will order medication to help w/ symptoms. Provided informational counseling resources and LCSW contact information.   2. Uncontrolled type 2 diabetes mellitus with hyperglycemia (HCC)  - Glucose (CBG) - HgB A1c - insulin aspart (novoLOG) injection 10 Units - Blood Glucose Monitoring Suppl (TRUE METRIX METER) w/Device KIT; Use as directed  Dispense: 1 kit; Refill: 0 - glucose blood (TRUE METRIX BLOOD GLUCOSE TEST) test strip; Use as instructed  Dispense: 100 each; Refill: 12 - TRUEPLUS LANCETS 28G MISC; Use as directed  Dispense: 100 each; Refill: 12 - metFORMIN (GLUCOPHAGE) 1000 MG tablet; Take 1 tablet (1,000 mg total) 2 (two) times daily with a meal by mouth.  Dispense: 60 tablet; Refill: 6 - glipiZIDE (GLUCOTROL) 10 MG tablet; Take 1 tablet (10 mg total) daily before breakfast by mouth.  Dispense: 60 tablet; Refill: 2 - Glucose (CBG)  3. Screening for STDs (sexually transmitted diseases)  - HEP, RPR, HIV Panel; Future - HSV(herpes simplex vrs) 1+2 ab-IgG; Future - HSV(herpes simplex vrs) 1+2 ab-IgG - HEP, RPR, HIV Panel - Cervicovaginal ancillary only  4. Medication refill  - pravastatin (PRAVACHOL) 20 MG tablet; Take 1 tablet (20 mg total) daily by mouth.  Dispense: 30 tablet; Refill: 5  5. Grief reaction  - hydrOXYzine (VISTARIL) 25 MG capsule; Take 1 capsule (25 mg total) 3 (three) times daily as needed by mouth. Prn anxiety  Dispense: 30 capsule; Refill: 0  6.  Anxiety  - amitriptyline (ELAVIL) 25 MG tablet; Take 1 tablet (25 mg total) at bedtime by mouth.  Dispense: 30 tablet; Refill: 5  7. Needs flu shot  - Flu Vaccine QUAD 6+ mos PF IM (Fluarix Quad PF)       Follow-up: Return in about 1 week (around 10/29/2017) for Anxiety/Grief Reaction with [place].   Alfonse Spruce FNP

## 2017-10-22 NOTE — Progress Notes (Signed)
Patient is here for PAP   Patient complains bump on left pelvic area itchy dryness vagina area   Patent is requesting meter & supplies

## 2017-10-23 LAB — HEP, RPR, HIV PANEL
HEP B S AG: NEGATIVE
HIV SCREEN 4TH GENERATION: NONREACTIVE
RPR: NONREACTIVE

## 2017-10-23 LAB — HSV(HERPES SIMPLEX VRS) I + II AB-IGG
HSV 1 Glycoprotein G Ab, IgG: 42.7 index — ABNORMAL HIGH (ref 0.00–0.90)
HSV 2 IgG, Type Spec: 10.1 index — ABNORMAL HIGH (ref 0.00–0.90)

## 2017-10-24 LAB — CERVICOVAGINAL ANCILLARY ONLY
BACTERIAL VAGINITIS: NEGATIVE
CANDIDA VAGINITIS: NEGATIVE
Chlamydia: NEGATIVE
Neisseria Gonorrhea: NEGATIVE
TRICH (WINDOWPATH): NEGATIVE

## 2017-10-24 LAB — CYTOLOGY - PAP
Diagnosis: NEGATIVE
HPV: NOT DETECTED

## 2017-10-26 ENCOUNTER — Other Ambulatory Visit: Payer: Self-pay | Admitting: Family Medicine

## 2017-10-26 ENCOUNTER — Telehealth: Payer: Self-pay

## 2017-10-26 DIAGNOSIS — R768 Other specified abnormal immunological findings in serum: Secondary | ICD-10-CM

## 2017-10-26 DIAGNOSIS — N898 Other specified noninflammatory disorders of vagina: Secondary | ICD-10-CM

## 2017-10-26 LAB — CERVICOVAGINAL ANCILLARY ONLY: HERPES (WINDOWPATH): NEGATIVE

## 2017-10-26 MED ORDER — MICONAZOLE NITRATE 2 % VA CREA: TOPICAL_CREAM | VAGINAL | 0 refills | Status: DC

## 2017-10-26 MED ORDER — ACYCLOVIR 400 MG PO TABS: 400.0000 mg | ORAL_TABLET | Freq: Three times a day (TID) | ORAL | 0 refills | Status: DC

## 2017-10-26 NOTE — Telephone Encounter (Signed)
-----   Message from Lizbeth BarkMandesia R Hairston, FNP sent at 10/26/2017  8:13 AM EST ----- Regarding: Result not follow up For this patient she has medications in the pharmacy to pick up. One medication miconazole may not be available but it is an over the counter medication she can pick up at any pharmacy if our pharmacy doesn't carry it.  ----- Message ----- From: Roger KillAvila, Fraya Ueda, CMA Sent: 10/22/2017   4:14 PM To: Lizbeth BarkMandesia R Hairston, FNP

## 2017-10-26 NOTE — Telephone Encounter (Signed)
CMA call regarding lab results   Patient did not answer but left a VM stating the reason of the call &  to call me back  

## 2017-10-26 NOTE — Telephone Encounter (Signed)
-----   Message from Lizbeth BarkMandesia R Hairston, FNP sent at 10/26/2017  8:01 AM EST ----- Pap smear showed no lesions or malignancy. HIV, Hepatitis B, and syphilis are all negative. Gonorrhea, Chlamydia,, and Trichomonas were all negative. Herpes type 1 which is primarily responsible for cold sores is positive. When you have cold sores do not kiss anyone, share utensils, or have oral sex. -This test checks for herpes type 2 antibodies that indicate you have been exposed to the herpes virus in the past. 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 Herpes cannot be cured. To decrease the risk of spreading of herpes use a condom every time you have sex, do not have sex when you have symptoms (blisters, sores) -Herpes symptoms usually go away and come back. A return of symptoms is called an "outbreak." Outbreaks usually include blisters and open sores in the genital area

## 2017-10-29 ENCOUNTER — Ambulatory Visit: Payer: Self-pay

## 2017-10-29 ENCOUNTER — Telehealth: Payer: Self-pay | Admitting: Family Medicine

## 2017-10-29 NOTE — Telephone Encounter (Signed)
Pt call to get a referral for dental, please follow up

## 2017-10-30 ENCOUNTER — Telehealth: Payer: Self-pay | Admitting: Family Medicine

## 2017-10-30 NOTE — Telephone Encounter (Signed)
Pt came in to request her lab results Please call her back

## 2017-10-31 ENCOUNTER — Ambulatory Visit: Payer: Self-pay | Attending: Family Medicine | Admitting: Licensed Clinical Social Worker

## 2017-10-31 ENCOUNTER — Telehealth: Payer: Self-pay | Admitting: Family Medicine

## 2017-10-31 ENCOUNTER — Other Ambulatory Visit: Payer: Self-pay | Admitting: Family Medicine

## 2017-10-31 DIAGNOSIS — F4321 Adjustment disorder with depressed mood: Secondary | ICD-10-CM

## 2017-10-31 DIAGNOSIS — F419 Anxiety disorder, unspecified: Secondary | ICD-10-CM

## 2017-10-31 NOTE — Telephone Encounter (Signed)
CMA call regarding lab results   Patient was aware and understood her results

## 2017-10-31 NOTE — Telephone Encounter (Signed)
Spoke with patient & she mention that she is having pain she wants a RX like orajel or something for pain  & one of  her  back tooth is broken but no swelling of the cheek but her gums are swollen & she was aware that she needs to make an appt so pcp can evaluate her for her tooth pain

## 2017-10-31 NOTE — Telephone Encounter (Signed)
CMA call regarding f/up with dental referral   Patient was aware and understood

## 2017-10-31 NOTE — BH Specialist Note (Signed)
Integrated Behavioral Health Initial Visit  MRN: [id number] Name: Faith Alvarez  Number of Integrated Behavioral Health Clinician visits:: 2/6 Session Start time: 4:20 PM  Session End time: 4:50 PM Total time: 30 minutes  Type of Service: Integrated Behavioral Health- Individual/Family Interpretor:Yes.   Interpretor Name and Language: Ignacia BayleyJulio #[id number] Spanish   Warm Hand Off Completed.       SUBJECTIVE: Faith Alvarez is a 46 y.o. female accompanied by minor child Patient was referred by FNP [place] for depression and anxiety. Patient reports the following symptoms/concerns: overwhelming feelings of sadness about the passing of father three months ago, worry about ill mother, difficulty sleeping, low energy, decreased concentration, and decreased appetite Duration of problem: Ongoing; Severity of problem: moderately severe  OBJECTIVE: Mood: Anxious and Depressed and Affect: Depressed and Tearful Risk of harm to self or others: No plan to harm self or others States sometimes she thinks she "should've left" instead of her father. No current SI/HI or intent to harm self or others  LIFE CONTEXT: Family and Social: Pt receives limited support from family.  School/Work: Pt is employed full time Self-Care: Pt is participating in medication management and is open to grief counseling Life Changes: Pt is grieving the death of her father three months ago and is experiencing increased anxiety about mother's declining health. Pt reports caregiver stress  GOALS ADDRESSED: Patient will: 1. Reduce symptoms of: anxiety, depression and stress 2. Increase knowledge and/or ability of: coping skills  3. Demonstrate ability to: Increase adequate support systems for patient/family and Begin healthy grieving over loss  INTERVENTIONS: Interventions utilized: Supportive Counseling, Psychoeducation and/or Health Education and Link to [facility]  Standardized Assessments completed: Not  Needed  ASSESSMENT: Patient currently experiencing depression and anxiety triggered by caregiver stress and the passing of pt's father three months ago. Pt reports overwhelming feelings of sadness about the passing of father three months ago, worry about ill mother, difficulty sleeping, low energy, decreased concentration, and decreased appetite. She receives limited support from family. Pt reports hx of suicidal ideations; however, denied current plan or intent to harm self or others. LCSWA inquired about protective factors. Crisis intervention resources were provided.   Patient is participating in medication management through PCP. LCSWA educated pt on the stages of grief and encouraged her to participate in grief counseling and support. Pt was provided grief resources and agreed to schedule appointment with hospice. LCSWA engaged pt on healthy coping skills. Crisis resources were provided.    PLAN: 1. Follow up with behavioral health clinician on : Pt was encouraged to contact LCSWA if symptoms worsen or fail to improve to [facility]. 2. Behavioral recommendations: LCSWA recommends that pt apply healthy coping skills discussed, comply with medication management, and initiate grief counseling. Pt is encouraged to schedule follow up appointment with LCSWA 3. Referral(s): Hospice for Grief Counseling 4. "From scale of 1-10, how likely are you to follow plan?": 8/10  Bridgett LarssonJasmine D Lewis, LCSW 10/31/17 6:19 PM

## 2017-10-31 NOTE — Telephone Encounter (Signed)
Please ask patient what symptoms she is having so referral can be placed.  If she is experiences signs and symptoms of abscess (facial swelling, drainage, fever) she needs to be seen in office. Please make pt.aware of waiting list for dental referrals. If any resources available for upcoming dental referrals please provide patient.

## 2017-10-31 NOTE — Telephone Encounter (Signed)
Faith ShamsGilberto # [phone] Patient called regarding referral. Recommend OTC orajel and ibuprofen for symptoms, if pain persists willing to prescribe stronger analgesic. She declines stronger analgesics at this time. Precautions given regarding dental concerns. She communicates understanding and is agreeable to plan.

## 2017-11-26 ENCOUNTER — Other Ambulatory Visit: Payer: Self-pay | Admitting: Family Medicine

## 2017-12-24 ENCOUNTER — Ambulatory Visit: Payer: Self-pay | Admitting: Psychology

## 2017-12-31 ENCOUNTER — Encounter: Payer: Self-pay | Admitting: Family Medicine

## 2017-12-31 ENCOUNTER — Ambulatory Visit: Payer: Self-pay | Attending: Family Medicine | Admitting: Family Medicine

## 2017-12-31 VITALS — BP 115/78 | HR 88 | Temp 98.7°F | Resp 18 | Ht 62.0 in | Wt 127.0 lb

## 2017-12-31 DIAGNOSIS — K0381 Cracked tooth: Secondary | ICD-10-CM | POA: Insufficient documentation

## 2017-12-31 DIAGNOSIS — K029 Dental caries, unspecified: Secondary | ICD-10-CM | POA: Insufficient documentation

## 2017-12-31 DIAGNOSIS — Z79899 Other long term (current) drug therapy: Secondary | ICD-10-CM | POA: Insufficient documentation

## 2017-12-31 DIAGNOSIS — F329 Major depressive disorder, single episode, unspecified: Secondary | ICD-10-CM | POA: Insufficient documentation

## 2017-12-31 DIAGNOSIS — F419 Anxiety disorder, unspecified: Secondary | ICD-10-CM | POA: Insufficient documentation

## 2017-12-31 DIAGNOSIS — Z23 Encounter for immunization: Secondary | ICD-10-CM

## 2017-12-31 DIAGNOSIS — Z7984 Long term (current) use of oral hypoglycemic drugs: Secondary | ICD-10-CM | POA: Insufficient documentation

## 2017-12-31 DIAGNOSIS — E1165 Type 2 diabetes mellitus with hyperglycemia: Secondary | ICD-10-CM | POA: Insufficient documentation

## 2017-12-31 LAB — POCT URINALYSIS DIPSTICK
Bilirubin, UA: NEGATIVE
Glucose, UA: >=1000
KETONES UA: NEGATIVE
Leukocytes, UA: NEGATIVE
NITRITE: NEGATIVE
PH UA: 6 (ref 5.0–8.0)
PROTEIN UA: NEGATIVE
RBC UA: NEGATIVE
UROBILINOGEN UA: 1 U/dL

## 2017-12-31 LAB — GLUCOSE, POCT (MANUAL RESULT ENTRY): POC GLUCOSE: 308 mg/dL — AB (ref 70–99)

## 2017-12-31 MED ORDER — BENZOCAINE 10 % MT GEL: OROMUCOSAL | 0 refills | Status: DC

## 2017-12-31 MED ORDER — IBUPROFEN 600 MG PO TABS: 600.0000 mg | ORAL_TABLET | Freq: Three times a day (TID) | ORAL | 0 refills | Status: DC | PRN

## 2017-12-31 MED ORDER — SITAGLIPTIN PHOSPHATE 50 MG PO TABS: 50.0000 mg | ORAL_TABLET | Freq: Every day | ORAL | 2 refills | Status: DC
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
## 2017-12-31 NOTE — Patient Instructions (Signed)
Control del nivel sanguíneo de glucosa en los adultos °(Blood Glucose Monitoring, Adult) °El control del nivel de azúcar (glucosa) en la sangre lo ayuda a tener la diabetes bajo control. También ayuda a que usted y el médico controlen si el tratamiento de la diabetes es eficaz. El control del nivel sanguíneo de glucosa implica realizar controles regulares como lo indique el médico y llevar registro de los resultados (registro diario). °¿POR QUÉ DEBO CONTROLAR EL NIVEL SANGUÍNEO DE GLUCOSA? °Si controla su nivel sanguíneo de glucosa con regularidad, podrá: °· Comprender de qué manera los alimentos, la actividad física, las enfermedades y los medicamentos inciden en los niveles sanguíneos de glucosa. °· Conocer el nivel sanguíneo de glucosa en cualquier momento dado. Saber rápidamente si el nivel es bajo (hipoglucemia) o alto (hiperglucemia). °· Puede ser de ayuda para que usted y el médico sepan cómo ajustar los medicamentos. °¿CUÁNDO DEBO CONTROLAR EL NIVEL SANGUÍNEO DE GLUCOSA? °Siga las indicaciones del médico acerca de la frecuencia con la que debe controlar el nivel sanguíneo de glucosa. La frecuencia puede depender de: °· El tipo de diabetes que tenga. °· Si su diabetes está bajo control. °· Los medicamentos que toma. °Si usted tiene diabetes tipo 1: °· Controle su nivel sanguíneo de glucosa al menos dos veces al día. °· También controle su nivel sanguíneo de glucosa: °? Antes de cada inyección de insulina. °? Antes y después de hacer ejercicio. °? Entre las comidas. °? Dos horas después de una comida. °? Ocasionalmente, entre las 2:00 a. m. y las 3:00 a. m., como se lo hayan indicado. °? Antes de realizar tareas peligrosas, como manejar o usar maquinaria pesada. °? A la hora de acostarse. °· Es posible que deba controlar con más frecuencia los niveles sanguíneos de glucosa, hasta 6 a 10 veces por día: °? Si usa una bomba de insulina. °? Si necesita varias inyecciones diarias. °? Si su diabetes no está bien  controlada. °? Si está enfermo. °? Si tiene antecedentes de hipoglucemia grave. °? Si tiene antecedentes de no darse cuenta cuándo está bajando su nivel sanguíneo de glucosa (hipoglucemia asintomática). °Si usted tiene diabetes tipo 2: °· Si recibe insulina u otro medicamento para la diabetes, controle el nivel sanguíneo de glucosa al menos dos veces al día. °· Mientras reciba tratamiento intensivo con insulina, debe medirse el nivel sanguíneo de glucosa al menos 4 veces al día. Ocasionalmente, es posible que deba controlarse entre las 2:00 a. m. y las 3:00 a. m., según se lo indiquen. °· También controle su nivel sanguíneo de glucosa: °? Antes y después de hacer ejercicio. °? Antes de realizar tareas peligrosas, como manejar o usar maquinaria pesada. °· Es posible que deba controlar con más frecuencia los niveles sanguíneos de glucosa si: °? Es necesario ajustar la dosis de sus medicamentos. °? Su diabetes no está bien controlada. °? Está enfermo. °¿QUÉ ES UN REGISTRO DIARIO DEL NIVEL SANGUÍNEO DE GLUCOSA? °· Un registro diario es un registro de los valores de glucosa en la sangre. Puede ayudarles a usted y a su médico a: °? Buscar patrones en su nivel sanguíneo de glucosa durante el transcurso del tiempo. °? Ajustar su plan de control de la diabetes como sea necesario. °· Cada vez que controle su nivel sanguíneo de glucosa, anote el resultado y aquellos factores que pueden estar afectando su nivel sanguíneo de glucosa, como la dieta y la actividad física realizada en el día. °· La mayoría de los medidores de glucosa guardan un registro de las lecturas realizadas con el medidor. Algunos permiten descargar sus registros en   una computadora. ° °¿CÓMO ME CONTROLO EL NIVEL SANGUÍNEO DE GLUCOSA? °Siga los siguientes pasos para obtener lecturas precisas de su glucemia: °Materiales necesarios °· Medidor de glucosa en la sangre. °· Tiras reactivas para el medidor. Cada medidor tiene sus propias tiras reactivas. Debe usar  las tiras reactivas que trae su medidor. °· Una aguja para pincharse el dedo (lanceta). No utilice la misma lanceta en más de una ocasión. °· Un dispositivo que sujeta la lanceta (dispositivo de punción). °· Un diario o libro de anotaciones para anotar los resultados. °Procedimiento °· Lávese las manos con agua y jabón. °· Pínchese el costado del dedo (no la punta) con la lanceta. Use un dedo diferente cada vez. °· Frote suavemente el dedo hasta que aparezca una pequeña gota de sangre. °· Siga las instrucciones que vienen con el medidor para insertar la tira reactiva, aplicar la sangre sobre la tira y usar el medidor de glucosa en la sangre. °· Registre el resultado y las observaciones que desee. °Zonas del cuerpo alternativas para realizar las pruebas °· Algunos medidores le permiten tomar sangre para la prueba de otras zonas del cuerpo que no son el dedo (zonas alternativas). °· Si cree que tiene hipoglucemia o si tiene hipoglucemia asintomática, no utilice las zonas alternativas del cuerpo. En su lugar, use los dedos. °· Es posible que las zonas alternativas no sean tan precisas como los dedos porque el flujo de sangre es más lento en esas zonas. Esto significa que el resultado que obtiene de estas zonas puede estar retrasado y ser un poco diferente del resultado que obtendría del dedo. °· Los sitios alternativos más comunes son los siguientes: °? Los antebrazos. °? Los muslos. °? La palma de la mano. °Consejos adicionales °· Siempre tenga los insumos a mano. °· Todos los medidores de glucosa incluyen un número de teléfono "directo", disponible las 24 horas, al que podrá llamar si tiene preguntas o necesita ayuda. También puede consultar a su médico. °· Después de usar algunas cajas de tiras reactivas, ajuste (calibre) el medidor de glucemia según las instrucciones del medidor. °Esta información no tiene como fin reemplazar el consejo del médico. Asegúrese de hacerle al médico cualquier pregunta que  tenga. °Document Released: 11/27/2005 Document Revised: 03/20/2016 Document Reviewed: 05/08/2016 °Elsevier Interactive Patient Education © 2017 Elsevier Inc. ° °

## 2018-03-18 ENCOUNTER — Ambulatory Visit: Payer: Self-pay | Admitting: Nurse Practitioner

## 2018-04-03 ENCOUNTER — Ambulatory Visit: Payer: Self-pay

## 2018-04-17 ENCOUNTER — Ambulatory Visit: Payer: Self-pay | Attending: Internal Medicine | Admitting: Internal Medicine

## 2018-04-17 ENCOUNTER — Encounter: Payer: Self-pay | Admitting: Internal Medicine

## 2018-04-17 VITALS — BP 108/69 | HR 75 | Temp 98.7°F | Resp 16 | Wt 123.8 lb

## 2018-04-17 DIAGNOSIS — Z9119 Patient's noncompliance with other medical treatment and regimen: Secondary | ICD-10-CM | POA: Insufficient documentation

## 2018-04-17 DIAGNOSIS — F4322 Adjustment disorder with anxiety: Secondary | ICD-10-CM | POA: Insufficient documentation

## 2018-04-17 DIAGNOSIS — Z9114 Patient's other noncompliance with medication regimen: Secondary | ICD-10-CM | POA: Insufficient documentation

## 2018-04-17 DIAGNOSIS — Z79899 Other long term (current) drug therapy: Secondary | ICD-10-CM | POA: Insufficient documentation

## 2018-04-17 DIAGNOSIS — F329 Major depressive disorder, single episode, unspecified: Secondary | ICD-10-CM | POA: Insufficient documentation

## 2018-04-17 DIAGNOSIS — E782 Mixed hyperlipidemia: Secondary | ICD-10-CM | POA: Insufficient documentation

## 2018-04-17 DIAGNOSIS — F321 Major depressive disorder, single episode, moderate: Secondary | ICD-10-CM

## 2018-04-17 DIAGNOSIS — E1165 Type 2 diabetes mellitus with hyperglycemia: Secondary | ICD-10-CM | POA: Insufficient documentation

## 2018-04-17 DIAGNOSIS — N760 Acute vaginitis: Secondary | ICD-10-CM | POA: Insufficient documentation

## 2018-04-17 DIAGNOSIS — IMO0001 Reserved for inherently not codable concepts without codable children: Secondary | ICD-10-CM

## 2018-04-17 LAB — POCT GLYCOSYLATED HEMOGLOBIN (HGB A1C): Hemoglobin A1C: 11

## 2018-04-17 LAB — GLUCOSE, POCT (MANUAL RESULT ENTRY): POC Glucose: 223 mg/dl — AB (ref 70–99)

## 2018-04-17 MED ORDER — GLIPIZIDE 10 MG PO TABS: 10.0000 mg | ORAL_TABLET | Freq: Every day | ORAL | 5 refills | Status: DC

## 2018-04-17 MED ORDER — METFORMIN HCL 1000 MG PO TABS: 1000.0000 mg | ORAL_TABLET | Freq: Two times a day (BID) | ORAL | 6 refills | Status: DC

## 2018-04-17 MED ORDER — FLUCONAZOLE 150 MG PO TABS: 150.0000 mg | ORAL_TABLET | Freq: Once | ORAL | 0 refills | Status: AC

## 2018-04-17 MED ORDER — SERTRALINE HCL 50 MG PO TABS: ORAL_TABLET | ORAL | 1 refills | Status: DC

## 2018-04-17 MED ORDER — PRAVASTATIN SODIUM 20 MG PO TABS: 20.0000 mg | ORAL_TABLET | Freq: Every day | ORAL | 5 refills | Status: DC

## 2018-04-17 NOTE — Patient Instructions (Signed)
Depresin mayor (Major Depressive Disorder) La depresin mayor es un trastorno del estado de nimo. No es lo mismo que sentir tristeza cuando ocurre algo malo. Este trastorno puede durar das o semanas. Trabajar o hacer cosas con la familia y los amigos puede volverse difcil debido a este trastorno. La depresin mayor puede causar lo siguiente en las personas que la padecen:  Tristeza.  Vaco.  Desesperanza.  Impotencia.  Irritabilidad. Adems de estos sentimientos, las personas que padecen este trastorno tienen por lo menos 4de estos sntomas:  Problemas para dormir.  Dormir demasiado.  Un cambio importante en el apetito.  Un cambio importante en el peso.  Falta de energa.  Sentimientos de culpa o inutilidad.  Dificultad para concentrarse, recordar o tomar decisiones.  Movimientos lentos.  Agitacin.  Pensamientos o sueos de suicidio, o de daarse a s mismas.  Intento de suicidio. CUIDADOS EN EL HOGAR  Tome los medicamentos de venta libre y los recetados solamente como se lo haya indicado el mdico.  Concurra a todas las visitas de control como se lo haya indicado el mdico. Esto incluye cualquier terapia que le recomiende el mdico.  Reduzca el nivel de estrs. Haga cosas que disfruta, como andar en bicicleta, caminar o leer un libro.  Haga ejercicio fsico con frecuencia.  Consuma una dieta saludable.  No beba alcohol.  No consuma drogas.  Busque el apoyo de sus familiares y amigos.  SOLICITE AYUDA DE INMEDIATO SI:  Empieza a escuchar voces.  Ve cosas que no existen.  Siente que las personas lo persiguen (paranoico).  Tiene pensamientos serios acerca de lastimarse a usted mismo o daar a otras personas.  Piensa en el suicidio.  Esta informacin no tiene como fin reemplazar el consejo del mdico. Asegrese de hacerle al mdico cualquier pregunta que tenga. Document Released: 11/08/2015 Document Revised: 11/08/2015 Document Reviewed:  12/23/2012 Elsevier Interactive Patient Education  2017 Elsevier Inc.  

## 2018-04-17 NOTE — Progress Notes (Signed)
Patient ID: Faith Alvarez, female    DOB: 08/28/1971  MRN: [id number]  CC: re-establish and Diabetes   Subjective: Faith Alvarez is a 47 y.o. female who presents for chronic ds management.  Her daughter is with her. Her concerns today include:  DM, anx/dep, HL  DM:  Not checking BS.  She has device but not checking and not taking meds consistently for several months.  Depressed after father past in 07/2017. Feels very "low" and gets agitated easily Lacks energy and interest to do anything.  Poor appetite.  Oversleeping.  Denies any suicidal ideation  She complains of vaginal itching.  No discharge. Patient Active Problem List   Diagnosis Date Noted  . 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 Grief reaction 10/22/2017  . Diabetic mononeuropathy associated with type 2 diabetes mellitus ([place]) 10/22/2017  . Anxiety 10/22/2017  . Uncontrolled type 2 diabetes mellitus ([place]) 09/04/2017  . Mixed hyperlipidemia 07/25/2017  . Intractable nausea and vomiting 04/01/2017  . Malaise and fatigue 04/01/2017  . DKA (diabetic ketoacidoses) ([place]) 03/31/2017     Current Outpatient Medications on File Prior to Visit  Medication Sig Dispense Refill  . benzocaine (ORAJEL) 10 % mucosal gel Use as directed 5.3 g 0  . Blood Glucose Monitoring Suppl (TRUE METRIX METER) w/Device KIT Use as directed 1 kit 0  . glucose blood (TRUE METRIX BLOOD GLUCOSE TEST) test strip Use as instructed 100 each 12  . hydrOXYzine (VISTARIL) 25 MG capsule Take 1 capsule (25 mg total) 3 (three) times daily as needed by mouth. Prn anxiety 30 capsule 0  . ibuprofen (ADVIL,MOTRIN) 600 MG tablet Take 1 tablet (600 mg total) by mouth every 8 (eight) hours as needed. 30 tablet 0  . sitaGLIPtin (JANUVIA) 50 MG tablet Take 1 tablet (50 mg total) by mouth daily. 30 tablet 2  . TRUEPLUS LANCETS 28G MISC Use as directed 100 each 12   No current facility-administered medications on file prior to visit.     No  Known Allergies  Social History   Socioeconomic History  . Marital status: Married    Spouse name: Not on file  . Number of children: Not on file  . Years of education: Not on file  . Highest education level: Not on file  Occupational History  . Not on file  Social Needs  . Financial resource strain: Not on file  . Food insecurity:    Worry: Not on file    Inability: Not on file  . Transportation needs:    Medical: Not on file    Non-medical: Not on file  Tobacco Use  . Smoking status: Never Smoker  . Smokeless tobacco: Never Used  Substance and Sexual Activity  . Alcohol use: Not on file  . Drug use: Not on file  . Sexual activity: Not on file  Lifestyle  . Physical activity:    Days per week: Not on file    Minutes per session: Not on file  . Stress: Not on file  Relationships  . Social connections:    Talks on phone: Not on file    Gets together: Not on file    Attends religious service: Not on file    Active member of club or organization: Not on file    Attends meetings of clubs or organizations: Not on file    Relationship status: Not on file  . Intimate partner violence:    Fear of current or ex partner: Not on file  Emotionally abused: Not on file    Physically abused: Not on file    Forced sexual activity: Not on file  Other Topics Concern  . Not on file  Social History Narrative  . Not on file    No family history on file.  No past surgical history on file.  ROS: Review of Systems Neg except as above PHYSICAL EXAM: BP 108/69   Pulse 75   Temp 98.7 F (37.1 C) (Oral)   Resp 16   Wt 123 lb 12.8 oz (56.2 kg)   SpO2 98%   BMI 22.64 kg/m   Wt Readings from Last 3 Encounters:  04/17/18 123 lb 12.8 oz (56.2 kg)  12/31/17 127 lb (57.6 kg)  10/22/17 124 lb 9.6 oz (56.5 kg)    Physical Exam General appearance -middle-aged Hispanic female in NAD Mental status -patient is tearful.   Neck - supple, no significant adenopathy Chest - clear to  auscultation, no wheezes, rales or rhonchi, symmetric air entry Heart - normal rate, regular rhythm, normal S1, S2, no murmurs, rubs, clicks or gallops   Results for orders placed or performed in visit on 04/17/18  POCT glucose (manual entry)  Result Value Ref Range   POC Glucose 223 (A) 70 - 99 mg/dl  POCT glycosylated hemoglobin (Hb A1C)  Result Value Ref Range   Hemoglobin A1C 11.0     ASSESSMENT AND PLAN: 1. Diabetes mellitus type 2, uncontrolled, without complications (HCC) Not at goal due to noncompliance with medication.  Her noncompliance is due to depression so we will work on that to try to get the depression better as well as trying to get her back on her diabetes medications - POCT glucose (manual entry) - POCT glycosylated hemoglobin (Hb A1C) - metFORMIN (GLUCOPHAGE) 1000 MG tablet; Take 1 tablet (1,000 mg total) by mouth 2 (two) times daily with a meal.  Dispense: 60 tablet; Refill: 6 - pravastatin (PRAVACHOL) 20 MG tablet; Take 1 tablet (20 mg total) by mouth daily.  Dispense: 30 tablet; Refill: 5 - glipiZIDE (GLUCOTROL) 10 MG tablet; Take 1 tablet (10 mg total) by mouth daily before breakfast.  Dispense: 60 tablet; Refill: 5 - Comprehensive metabolic panel - CBC  2. Current moderate episode of major depressive disorder, unspecified whether recurrent ([place]) LCSW was not available to see her today.  Appointment has been scheduled.  We discussed starting her on medication to help with the depression and patient is agreeable to this.  Started on Zoloft and will have her follow-up with me in 1 month - sertraline (ZOLOFT) 50 MG tablet; 1/2 PO daily x 2 wks then 1 tab daily.  Dispense: 30 tablet; Refill: 1  3. Acute vaginitis Treat empirically for yeast - fluconazole (DIFLUCAN) 150 MG tablet; Take 1 tablet (150 mg total) by mouth once for 1 dose.  Dispense: 1 tablet; Refill: 0 - Cervicovaginal ancillary only  Patient was given the opportunity to ask questions.  Patient  verbalized understanding of the plan and was able to repeat key elements of the plan.   Orders Placed This Encounter  Procedures  . Comprehensive metabolic panel  . CBC  . POCT glucose (manual entry)  . POCT glycosylated hemoglobin (Hb A1C)     Requested Prescriptions   Signed Prescriptions Disp Refills  . metFORMIN (GLUCOPHAGE) 1000 MG tablet 60 tablet 6    Sig: Take 1 tablet (1,000 mg total) by mouth 2 (two) times daily with a meal.  . pravastatin (PRAVACHOL) 20 MG tablet 30 tablet  5    Sig: Take 1 tablet (20 mg total) by mouth daily.  Marland Kitchen glipiZIDE (GLUCOTROL) 10 MG tablet 60 tablet 5    Sig: Take 1 tablet (10 mg total) by mouth daily before breakfast.  . sertraline (ZOLOFT) 50 MG tablet 30 tablet 1    Sig: 1/2 PO daily x 2 wks then 1 tab daily.  . fluconazole (DIFLUCAN) 150 MG tablet 1 tablet 0    Sig: Take 1 tablet (150 mg total) by mouth once for 1 dose.    Return in about 1 month (around 05/18/2018).  Karle Plumber, MD, FACP

## 2018-04-18 LAB — COMPREHENSIVE METABOLIC PANEL
ALK PHOS: 97 IU/L (ref 39–117)
ALT: 7 IU/L (ref 0–32)
AST: 12 IU/L (ref 0–40)
Albumin/Globulin Ratio: 1.5 (ref 1.2–2.2)
Albumin: 4.3 g/dL (ref 3.5–5.5)
BUN/Creatinine Ratio: 29 — ABNORMAL HIGH (ref 9–23)
BUN: 12 mg/dL (ref 6–24)
Bilirubin Total: 0.2 mg/dL (ref 0.0–1.2)
CO2: 22 mmol/L (ref 20–29)
Calcium: 9.1 mg/dL (ref 8.7–10.2)
Chloride: 99 mmol/L (ref 96–106)
Creatinine, Ser: 0.42 mg/dL — ABNORMAL LOW (ref 0.57–1.00)
GFR calc Af Amer: 143 mL/min/{1.73_m2} (ref >59)
GFR calc non Af Amer: 124 mL/min/{1.73_m2} (ref >59)
GLOBULIN, TOTAL: 2.8 g/dL (ref 1.5–4.5)
Glucose: 178 mg/dL — ABNORMAL HIGH (ref 65–99)
Potassium: 3.9 mmol/L (ref 3.5–5.2)
SODIUM: 135 mmol/L (ref 134–144)
Total Protein: 7.1 g/dL (ref 6.0–8.5)

## 2018-04-18 LAB — CBC
HEMATOCRIT: 41.8 % (ref 34.0–46.6)
HEMOGLOBIN: 13.8 g/dL (ref 11.1–15.9)
MCH: 30.5 pg (ref 26.6–33.0)
MCHC: 33 g/dL (ref 31.5–35.7)
MCV: 93 fL (ref 79–97)
Platelets: 280 10*3/uL (ref 150–379)
RBC: 4.52 x10E6/uL (ref 3.77–5.28)
RDW: 13.6 % (ref 12.3–15.4)
WBC: 7.8 10*3/uL (ref 3.4–10.8)

## 2018-04-19 ENCOUNTER — Telehealth: Payer: Self-pay

## 2018-04-19 LAB — CERVICOVAGINAL ANCILLARY ONLY
CANDIDA VAGINITIS: POSITIVE — AB
CHLAMYDIA, DNA PROBE: NEGATIVE
Neisseria Gonorrhea: NEGATIVE
Trichomonas: NEGATIVE

## 2018-04-19 NOTE — Telephone Encounter (Signed)
-----   Message from Particia Lather, [place] sent at 04/19/2018  4:09 PM EDT -----   ----- Message ----- From: Marcine Matar, MD Sent: 04/18/2018   7:45 AM To: Particia Lather, RMA  Let patient know that her kidney and liver function tests are normal.  Blood count is normal meaning no anemia.

## 2018-04-19 NOTE — Telephone Encounter (Signed)
CMA call patient regarding lab results   Patient did not answer but left a detailed message & to call back if have any questions   If patient call back just let the patient know her kidney & liver function was normal & that her Blood Count  Is normal meaning no anemia

## 2018-04-22 ENCOUNTER — Ambulatory Visit: Payer: Self-pay

## 2018-04-24 ENCOUNTER — Telehealth: Payer: Self-pay

## 2018-04-24 NOTE — Telephone Encounter (Signed)
-----   Message from Particia Lather, [place] sent at 04/19/2018  4:43 PM EDT -----   ----- Message ----- From: Marcine Matar, MD Sent: 04/19/2018   4:39 PM To: Particia Lather, RMA  Let pt know that vaginal secretions pos for yeast.  I already prescribed a 1 time dose of Fluconazole on her recent visit.  Hopefully she already picked it up and took it.

## 2018-04-24 NOTE — Telephone Encounter (Signed)
CMA call regarding lab results   Patient Verify DOB   Patient was aware and understood  

## 2018-04-24 NOTE — Telephone Encounter (Signed)
-----   Message from Particia Lather, [place] sent at 04/19/2018  4:09 PM EDT -----   ----- Message ----- From: Marcine Matar, MD Sent: 04/18/2018   7:45 AM To: Particia Lather, RMA  Let patient know that her kidney and liver function tests are normal.  Blood count is normal meaning no anemia.

## 2018-05-20 ENCOUNTER — Ambulatory Visit: Payer: Self-pay | Admitting: Internal Medicine

## 2018-05-27 ENCOUNTER — Ambulatory Visit: Payer: Self-pay | Attending: Internal Medicine

## 2018-10-14 ENCOUNTER — Ambulatory Visit: Payer: Self-pay | Attending: Internal Medicine | Admitting: Internal Medicine

## 2018-10-14 ENCOUNTER — Encounter: Payer: Self-pay | Admitting: Internal Medicine

## 2018-10-14 ENCOUNTER — Ambulatory Visit: Payer: Self-pay | Attending: Internal Medicine | Admitting: Licensed Clinical Social Worker

## 2018-10-14 VITALS — BP 130/83 | HR 70 | Temp 98.2°F | Resp 16 | Wt 126.4 lb

## 2018-10-14 DIAGNOSIS — F332 Major depressive disorder, recurrent severe without psychotic features: Secondary | ICD-10-CM

## 2018-10-14 DIAGNOSIS — N898 Other specified noninflammatory disorders of vagina: Secondary | ICD-10-CM

## 2018-10-14 DIAGNOSIS — F321 Major depressive disorder, single episode, moderate: Secondary | ICD-10-CM

## 2018-10-14 DIAGNOSIS — Z794 Long term (current) use of insulin: Secondary | ICD-10-CM | POA: Insufficient documentation

## 2018-10-14 DIAGNOSIS — E782 Mixed hyperlipidemia: Secondary | ICD-10-CM | POA: Insufficient documentation

## 2018-10-14 DIAGNOSIS — Z79899 Other long term (current) drug therapy: Secondary | ICD-10-CM | POA: Insufficient documentation

## 2018-10-14 DIAGNOSIS — F329 Major depressive disorder, single episode, unspecified: Secondary | ICD-10-CM | POA: Insufficient documentation

## 2018-10-14 DIAGNOSIS — F419 Anxiety disorder, unspecified: Secondary | ICD-10-CM | POA: Insufficient documentation

## 2018-10-14 DIAGNOSIS — IMO0001 Reserved for inherently not codable concepts without codable children: Secondary | ICD-10-CM

## 2018-10-14 DIAGNOSIS — E119 Type 2 diabetes mellitus without complications: Secondary | ICD-10-CM | POA: Insufficient documentation

## 2018-10-14 DIAGNOSIS — E1141 Type 2 diabetes mellitus with diabetic mononeuropathy: Secondary | ICD-10-CM

## 2018-10-14 DIAGNOSIS — E1165 Type 2 diabetes mellitus with hyperglycemia: Secondary | ICD-10-CM

## 2018-10-14 LAB — POCT GLYCOSYLATED HEMOGLOBIN (HGB A1C): HbA1c, POC (controlled diabetic range): 11.2 % — AB (ref 0.0–7.0)

## 2018-10-14 LAB — GLUCOSE, POCT (MANUAL RESULT ENTRY): POC Glucose: 229 mg/dl — AB (ref 70–99)

## 2018-10-14 MED ORDER — GLIPIZIDE 10 MG PO TABS: 10.0000 mg | ORAL_TABLET | Freq: Every day | ORAL | 5 refills | Status: DC

## 2018-10-14 MED ORDER — SERTRALINE HCL 50 MG PO TABS: ORAL_TABLET | ORAL | 1 refills | Status: DC

## 2018-10-14 MED ORDER — FLUCONAZOLE 150 MG PO TABS: 150.0000 mg | ORAL_TABLET | Freq: Once | ORAL | 0 refills | Status: AC

## 2018-10-14 MED ORDER — METFORMIN HCL 1000 MG PO TABS: 1000.0000 mg | ORAL_TABLET | Freq: Two times a day (BID) | ORAL | 6 refills | Status: DC

## 2018-10-14 NOTE — Patient Instructions (Signed)
Vacuna antineumoccica de polisacridos: Lo que debe saber (Pneumococcal Polysaccharide Vaccine: What You Need to Know) 1. Por qu vacunarse? La vacunacin puede proteger a los adultos mayores (y a algunos nios y adultos ms jvenes) de la enfermedad neumoccica. La enfermedad neumoccica es causada por una bacteria que puede contagiarse de una persona a otra por contacto directo. Puede provocar infecciones en los odos y tambin infecciones ms graves en:  los pulmones (neumona),  la sangre (bacteriemia), y  las membranas que cubren el cerebro y la mdula espinal (meningitis). La meningitis puede causar sordera y dao cerebral, y puede ser mortal. Cualquier persona puede contraer la enfermedad neumoccica, pero los nios menores de 2 aos, las personas con determinadas enfermedades, los adultos mayores de 65 aos y los fumadores son los que corren un mayor riesgo. Cada ao, mueren alrededor de [address]000 adultos mayores a causa de la enfermedad neumoccica en los [place]. El tratamiento de las infecciones neumoccicas con penicilina y otros medicamentos era ms efectivo en el pasado. Sin embargo, algunas cepas de la bacteria que causa la enfermedad se han vuelto resistentes a estos medicamentos. Esto hace que la prevencin de la enfermedad mediante la vacunacin sea an ms importante. 2. Vacuna antineumoccica de polisacridos (PPSV23) La vacuna antineumoccica de polisacridos (PPSV23) protege contra 23tipos de bacterias neumoccicas. No previene todas las enfermedades neumoccicas. La vacuna PPSV23 se recomienda para las siguientes personas:  Todos los adultos de 65aos en adelante.  Toda persona de 2a 64aos que tenga un problema de salud a largo plazo.  Las personas de 2 a 64 aos que tengan el sistema inmunitario dbil.  Los adultos de 19 a 64 aos que fumen o tengan asma. La mayora de las personas solo necesitan una dosis de la vacuna PPSV. Para ciertos grupos de alto  riesgo se recomienda una segunda dosis. Las personas mayores de 65 aos deben recibir una dosis de la vacuna, aun si recibieron una o ms dosis antes de cumplir los 65aos. El mdico puede brindarle ms informacin sobre estas recomendaciones. La mayora de los adultos sanos adquiere proteccin 2 a 3semanas despus de haber recibido la vacuna. 3. Algunas personas no deben recibir la vacuna  Toda persona que haya sufrido una reaccin alrgica potencialmente mortal a la PPSV no debe recibir otra dosis.  Las personas que tengan alergia grave a los componentes de la vacuna PPSV no deben recibirla. Informe a su mdico si sufre de algn tipo de alergia grave.  Toda persona que est enferma en un grado moderado o grave el da en que est programada la vacunacin, probablemente deba esperar a recuperarse para recibirla. Por lo general, una persona con una enfermedad leve puede vacunarse.  Los nios menores de 2 aos no deben recibir esta vacuna.  No hay pruebas de que la vacuna PPSV sea perjudicial para las mujeres embarazadas o el feto. No obstante, como precaucin, las mujeres que necesiten aplicarse la vacuna deben hacerlo antes de quedar embarazadas, si es posible.  4. Riesgos de una reaccin a la vacuna Con cualquier medicamento, incluyendo las vacunas, existe la posibilidad de que aparezcan efectos secundarios. Estos son leves y desaparecen por s solos, pero tambin son posibles las reacciones graves. Aproximadamente la mitad de las personas que reciben la PPSV presentan efectos secundarios leves, como enrojecimiento o dolor en el lugar donde se aplic la vacuna, que desaparecen a los dos das. Menos de 1 de cada 100personas presenta fiebre, dolores musculares o reacciones locales ms graves. Problemas que podran ocurrir despus   de cualquier vacuna:  Las personas a veces se desmayan despus de un procedimiento mdico, incluso despus de recibir una vacuna. Si permanece sentado o recostado  durante 15 minutos puede ayudar a evitar los desmayos y las lesiones causadas por las cadas. Informe al mdico si se siente mareado, tiene cambios en la visin o zumbidos en los odos.  Algunas personas sienten un dolor intenso en el hombro y tienen dificultad para mover el brazo donde se coloc la vacuna. Esto sucede con muy poca frecuencia.  Cualquier medicamento puede causar una reaccin alrgica grave. Dichas reacciones son muy poco frecuentes con una vacuna (se calcula que menos de 1en un milln de dosis) y se producen unos minutos a unas horas despus de la vacunacin. Al igual que con cualquier medicamento, existe una probabilidad remota de que una vacuna cause una lesin grave o la muerte. Se controla permanentemente la seguridad de las vacunas. Para obtener ms informacin, visite: www.cdc.gov/vaccinesafety/. 5. Qu pasa si hay una reaccin grave? A qu signos debo estar atento? Observe todo lo que le preocupe, como signos de una reaccin alrgica grave, fiebre muy alta o comportamiento fuera de lo normal. Los signos de una reaccin alrgica grave pueden incluir ronchas, hinchazn de la cara y la garganta, dificultad para respirar, latidos cardacos acelerados, mareos y debilidad. Generalmente, estos comenzaran entre unos pocos minutos y algunas horas despus de la vacunacin. Qu debo hacer? Si usted piensa que se trata de una reaccin alrgica grave o de otra emergencia que no puede esperar, llame al 911 o dirjase al hospital ms cercano. Sino, llame a su mdico. Despus, la reaccin debe informarse al Sistema de Informacin sobre Efectos Adversos de las Vacunas (Vaccine Adverse Event Reporting System, VAERS). Su mdico puede presentar este informe, o puede hacerlo usted mismo a travs del sitio web de VAERS, en www.vaers.hhs.gov, o llamando al 1-800-822-7967. VAERS no brinda recomendaciones mdicas. 6. Cmo puedo obtener ms informacin?  Consulte a su mdico. Este puede darle el  prospecto de la vacuna o recomendarle otras fuentes de informacin.  Comunquese con el servicio de salud de su localidad o su estado.  Comunquese con los Centros para el Control y la Prevencin de Enfermedades (Centers for Disease Control and Prevention , CDC). ? Llame al 1-800-232-4636 (1-800-CDC-INFO), o ? Visite el sitio web de los CDC en www.cdc.gov/vaccines. Declaracin de informacin sobre la vacuna antineumoccica de polisacridos de los CDC (04/03/14) Esta informacin no tiene como fin reemplazar el consejo del mdico. Asegrese de hacerle al mdico cualquier pregunta que tenga. Document Released: 02/23/2009 Document Revised: 12/18/2014 Document Reviewed: 04/06/2014 Elsevier Interactive Patient Education  2017 Elsevier Inc.  

## 2018-10-14 NOTE — Progress Notes (Signed)
Patient ID: Faith Alvarez, female    DOB: 05-Aug-1971  MRN: [id number]  CC: No chief complaint on file.   Subjective: Faith Alvarez is a 47 y.o. female who presents for chronic ds management. Her concerns today include:  DM, anx/dep, HL  DM/HL:  Stopped taking meds for a little over 1 mth.  Stopped taking meds "because I felt desperate and did not feel like doing anything."  Son was hosp for 8 days recently and father died one yr ago.  Depressed about both of these things.  Placed on Zoloft when I saw her last in May 2019.  It was helping but she ran out of it.  Denies an SI ideation. -she has bottles of Metformin, Glucotrol and Pravachol with her today  HM:  Had flu shot 2 wks ago at church. Patient Active Problem List   Diagnosis Date Noted  . 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 Grief reaction 10/22/2017  . Diabetic mononeuropathy associated with type 2 diabetes mellitus ([place]) 10/22/2017  . Anxiety 10/22/2017  . Uncontrolled type 2 diabetes mellitus ([place]) 09/04/2017  . Mixed hyperlipidemia 07/25/2017  . Intractable nausea and vomiting 04/01/2017  . Malaise and fatigue 04/01/2017  . DKA (diabetic ketoacidoses) ([place]) 03/31/2017     Current Outpatient Medications on File Prior to Visit  Medication Sig Dispense Refill  . pravastatin (PRAVACHOL) 20 MG tablet Take 1 tablet (20 mg total) by mouth daily. 30 tablet 5  . Blood Glucose Monitoring Suppl (TRUE METRIX METER) w/Device KIT Use as directed (Patient not taking: Reported on 10/14/2018) 1 kit 0  . glucose blood (TRUE METRIX BLOOD GLUCOSE TEST) test strip Use as instructed (Patient not taking: Reported on 10/14/2018) 100 each 12  . TRUEPLUS LANCETS 28G MISC Use as directed (Patient not taking: Reported on 10/14/2018) 100 each 12   No current facility-administered medications on file prior to visit.     No Known Allergies  Social History   Socioeconomic History  . Marital status: Married    Spouse  name: Not on file  . Number of children: Not on file  . Years of education: Not on file  . Highest education level: Not on file  Occupational History  . Not on file  Social Needs  . Financial resource strain: Not on file  . Food insecurity:    Worry: Not on file    Inability: Not on file  . Transportation needs:    Medical: Not on file    Non-medical: Not on file  Tobacco Use  . Smoking status: Never Smoker  . Smokeless tobacco: Never Used  Substance and Sexual Activity  . Alcohol use: Not on file  . Drug use: Not on file  . Sexual activity: Not on file  Lifestyle  . Physical activity:    Days per week: Not on file    Minutes per session: Not on file  . Stress: Not on file  Relationships  . Social connections:    Talks on phone: Not on file    Gets together: Not on file    Attends religious service: Not on file    Active member of club or organization: Not on file    Attends meetings of clubs or organizations: Not on file    Relationship status: Not on file  . Intimate partner violence:    Fear of current or ex partner: Not on file    Emotionally abused: Not on file    Physically abused: Not  on file    Forced sexual activity: Not on file  Other Topics Concern  . Not on file  Social History Narrative  . Not on file    No family history on file.  No past surgical history on file.  ROS: Review of Systems GU:  C/o vaginal itching without dischg  PHYSICAL EXAM: BP 130/83   Pulse 70   Temp 98.2 F (36.8 C) (Oral)   Resp 16   Wt 126 lb 6.4 oz (57.3 kg)   SpO2 98%   BMI 23.12 kg/m   Physical Exam General appearance - alert, well appearing, and in no distress Mental status - depress mood.  Tearful at times Neck - supple, no significant adenopathy Chest - clear to auscultation, no wheezes, rales or rhonchi, symmetric air entry Heart - normal rate, regular rhythm, normal S1, S2, no murmurs, rubs, clicks or gallops Extremities - peripheral pulses normal, no  pedal edema, no clubbing or cyanosis Results for orders placed or performed in visit on 10/14/18  POCT glucose (manual entry)  Result Value Ref Range   POC Glucose 229 (A) 70 - 99 mg/dl  POCT glycosylated hemoglobin (Hb A1C)  Result Value Ref Range   Hemoglobin A1C     HbA1c POC (<> result, manual entry)     HbA1c, POC (prediabetic range)     HbA1c, POC (controlled diabetic range) 11.2 (A) 0.0 - 7.0 %    ASSESSMENT AND PLAN:  1. Uncontrolled type 2 diabetes mellitus without complication, without long-term current use of insulin ([place]) Discussed the importance of healthy eating habits, regular aerobic exercise (at least 150 minutes a week as tolerated) and medication compliance to achieve or maintain control of diabetes. Pt to restart meds - POCT glucose (manual entry) - POCT glycosylated hemoglobin (Hb A1C) - Microalbumin / creatinine urine ratio - metFORMIN (GLUCOPHAGE) 1000 MG tablet; Take 1 tablet (1,000 mg total) by mouth 2 (two) times daily with a meal.  Dispense: 60 tablet; Refill: 6 - glipiZIDE (GLUCOTROL) 10 MG tablet; Take 1 tablet (10 mg total) by mouth daily before breakfast.  Dispense: 60 tablet; Refill: 5  2. Current moderate episode of major depressive disorder, unspecified whether recurrent ([place]) LCSW to see pt today. Restart Zoloft F/u in 6 wks - sertraline (ZOLOFT) 50 MG tablet; 1/2 PO daily x 2 wks then 1 tab daily.  Dispense: 30 tablet; Refill: 1  3. Vagina itching - fluconazole (DIFLUCAN) 150 MG tablet; Take 1 tablet (150 mg total) by mouth once for 1 dose.  Dispense: 1 tablet; Refill: 0 - Urine cytology ancillary only    Patient was given the opportunity to ask questions.  Patient verbalized understanding of the plan and was able to repeat key elements of the plan.  [employer] interpreter used during this encounter. #[id number]  Orders Placed This Encounter  Procedures  . Microalbumin / creatinine urine ratio  . POCT glucose (manual entry)  . POCT glycosylated  hemoglobin (Hb A1C)     Requested Prescriptions   Signed Prescriptions Disp Refills  . metFORMIN (GLUCOPHAGE) 1000 MG tablet 60 tablet 6    Sig: Take 1 tablet (1,000 mg total) by mouth 2 (two) times daily with a meal.  . glipiZIDE (GLUCOTROL) 10 MG tablet 60 tablet 5    Sig: Take 1 tablet (10 mg total) by mouth daily before breakfast.  . sertraline (ZOLOFT) 50 MG tablet 30 tablet 1    Sig: 1/2 PO daily x 2 wks then 1 tab daily.  . fluconazole (  DIFLUCAN) 150 MG tablet 1 tablet 0    Sig: Take 1 tablet (150 mg total) by mouth once for 1 dose.    Return in about 6 weeks (around 11/25/2018).  Karle Plumber, MD, FACP

## 2018-10-14 NOTE — BH Specialist Note (Signed)
Integrated Behavioral Health Initial Visit  MRN: [id number] Name: Faith Alvarez  Number of Integrated Behavioral Health Clinician visits:: 1/6 Session Start time: 11:40 AM  Session End time: 12:10 PM Total time: 30 minutes  Type of Service: Integrated Behavioral Health- Individual/Family Interpretor:Yes.   Interpretor Name and Language: Shannan Harper [id number]   SUBJECTIVE: Faith Alvarez is a 47 y.o. female accompanied by self Patient was referred by Dr. Laural Benes for depression and anxiety. Patient reports the following symptoms/concerns: Pt reports depression and anxiety triggered by the recent hospitalization of adult son and grieving the loss of her father Duration of problem: Over a year; Severity of problem: severe  OBJECTIVE: Mood: Anxious and Depressed and Affect: Depressed and Tearful Risk of harm to self or others: No plan to harm self or others  LIFE CONTEXT: Family and Social: Pt resides with son School/Work: Pt is employed Self-Care: Pt has agreed to re-initiate medication management through PCP Life Changes: Pt reports depression and anxiety triggered by the recent hospitalization of adult son and grieving the loss of her father  GOALS ADDRESSED: Patient will: 1. Reduce symptoms of: anxiety and depression 2. Increase knowledge and/or ability of: coping skills, healthy habits and self-management skills  3. Demonstrate ability to: Increase adequate support systems for patient/family, Increase motivation to adhere to plan of care and Improve medication compliance  INTERVENTIONS: Interventions utilized: Motivational Interviewing, Supportive Counseling and Link to [facility]  Standardized Assessments completed: GAD-7 and PHQ 2&9  ASSESSMENT: Patient currently experiencing depression and anxiety triggered by the recent hospitalization of adult son and grieving the loss of her father. No report of SI/HI.    Patient may benefit from psychotherapy. She has  agreed to re-initiate medication management through PCP. She shared that when she is on medication, her mood is improved and symptoms decreased. Coping skills to assist with maintaining motivation were discussed. Supportive resources for grief were provided.  PLAN: 1. Follow up with behavioral health clinician on : Pt was encouraged tocontact LCSWA if symptoms worsen or fail to improveto schedule behavioral appointments at [facility]. 2. Behavioral recommendations: LCSWA recommends that pt apply healthy coping skills discussed, comply with medication management, and initiate grief counseling. Pt is encouraged to schedule follow up appointment with LCSWA 3. Referral(s): Integrated [facility] [facility] (In Clinic) and Counselor 4. "From scale of 1-10, how likely are you to follow plan?":   Bridgett Larsson, LCSW 10/18/18 2:52 PM

## 2018-10-15 LAB — MICROALBUMIN / CREATININE URINE RATIO
Creatinine, Urine: 138.6 mg/dL
MICROALBUM., U, RANDOM: 10.7 ug/mL
Microalb/Creat Ratio: 7.7 mg/g creat (ref 0.0–30.0)

## 2018-10-16 ENCOUNTER — Telehealth: Payer: Self-pay

## 2018-10-16 LAB — URINE CYTOLOGY ANCILLARY ONLY: CANDIDA VAGINITIS: NEGATIVE

## 2018-10-16 NOTE — Telephone Encounter (Signed)
[employer] interpreters Sharlet [place]  Id#  [id number] contacted pt to go over lab results pt is aware and doesn't have any questions or concerns

## 2018-11-25 ENCOUNTER — Encounter: Payer: Self-pay | Admitting: Family Medicine

## 2018-11-25 ENCOUNTER — Other Ambulatory Visit: Payer: Self-pay

## 2018-11-25 ENCOUNTER — Ambulatory Visit: Payer: Self-pay | Attending: Family Medicine | Admitting: Family Medicine

## 2018-11-25 ENCOUNTER — Ambulatory Visit: Payer: Self-pay | Admitting: Critical Care Medicine

## 2018-11-25 VITALS — BP 127/72 | HR 83 | Temp 98.0°F | Ht 62.0 in | Wt 129.4 lb

## 2018-11-25 DIAGNOSIS — R0789 Other chest pain: Secondary | ICD-10-CM | POA: Insufficient documentation

## 2018-11-25 DIAGNOSIS — Z7984 Long term (current) use of oral hypoglycemic drugs: Secondary | ICD-10-CM | POA: Insufficient documentation

## 2018-11-25 DIAGNOSIS — F419 Anxiety disorder, unspecified: Secondary | ICD-10-CM | POA: Insufficient documentation

## 2018-11-25 DIAGNOSIS — E1165 Type 2 diabetes mellitus with hyperglycemia: Secondary | ICD-10-CM | POA: Insufficient documentation

## 2018-11-25 DIAGNOSIS — Z79899 Other long term (current) drug therapy: Secondary | ICD-10-CM | POA: Insufficient documentation

## 2018-11-25 LAB — GLUCOSE, POCT (MANUAL RESULT ENTRY): POC Glucose: 303 mg/dl — AB (ref 70–99)

## 2018-11-25 MED ORDER — HYDROXYZINE HCL 25 MG PO TABS: 25.0000 mg | ORAL_TABLET | Freq: Three times a day (TID) | ORAL | 1 refills | Status: DC | PRN

## 2018-11-25 NOTE — Progress Notes (Signed)
Subjective:  Patient ID: Karen Kitchens, female    DOB: 1970/12/28  Age: 47 y.o. MRN: [id number]  CC: Chest Pain   HPI Celestine Prim is a 47  Year old female with a history of Uncontrolled Type 2 Diabetes Mellitus (A1C 11.2),anxiety here with complaints of chest pain of 1 week duration,parasternal and it does not radiate. She denies associated numbness,diaphorses, radiation of pain. She becomes teary and complains of having anxiety with the Christmas approaching and the one year anniversary of her Dad's passing approaching. She denies reflux symptoms and denies suicidal ideations or intents.  Past Medical History:  Diagnosis Date  . Diabetes mellitus without complication ([place])     History reviewed. No pertinent surgical history.  No Known Allergies  Outpatient Medications Prior to Visit  Medication Sig Dispense Refill  . glipiZIDE (GLUCOTROL) 10 MG tablet Take 1 tablet (10 mg total) by mouth daily before breakfast. 60 tablet 5  . metFORMIN (GLUCOPHAGE) 1000 MG tablet Take 1 tablet (1,000 mg total) by mouth 2 (two) times daily with a meal. 60 tablet 6  . pravastatin (PRAVACHOL) 20 MG tablet Take 1 tablet (20 mg total) by mouth daily. 30 tablet 5  . sertraline (ZOLOFT) 50 MG tablet 1/2 PO daily x 2 wks then 1 tab daily. 30 tablet 1  . Blood Glucose Monitoring Suppl (TRUE METRIX METER) w/Device KIT Use as directed (Patient not taking: Reported on 10/14/2018) 1 kit 0  . glucose blood (TRUE METRIX BLOOD GLUCOSE TEST) test strip Use as instructed (Patient not taking: Reported on 10/14/2018) 100 each 12  . TRUEPLUS LANCETS 28G MISC Use as directed (Patient not taking: Reported on 10/14/2018) 100 each 12   No facility-administered medications prior to visit.     ROS Review of Systems  Constitutional: Negative for activity change, appetite change and fatigue.  HENT: Negative for congestion, sinus pressure and sore throat.   Eyes: Negative for visual disturbance.  Respiratory:  Negative for cough, chest tightness, shortness of breath and wheezing.   Cardiovascular: Positive for chest pain. Negative for palpitations.  Gastrointestinal: Negative for abdominal distention, abdominal pain and constipation.  Endocrine: Negative for polydipsia.  Genitourinary: Negative for dysuria and frequency.  Musculoskeletal: Negative for arthralgias and back pain.  Skin: Negative for rash.  Neurological: Negative for tremors, light-headedness and numbness.  Hematological: Does not bruise/bleed easily.  Psychiatric/Behavioral: Negative for agitation and behavioral problems.       Positive for anxiety    Objective:  BP 127/72   Pulse 83   Temp 98 F (36.7 C) (Oral)   Ht 5' 2" (1.575 m)   Wt 129 lb 6.4 oz (58.7 kg)   SpO2 98%   BMI 23.67 kg/m   BP/Weight 11/25/2018 60/05/7702 4/0/3524  Systolic BP 818 590 931  Diastolic BP 72 83 69  Wt. (Lbs) 129.4 126.4 123.8  BMI 23.67 23.12 22.64      Physical Exam Constitutional:      Appearance: She is well-developed.  Cardiovascular:     Rate and Rhythm: Normal rate.     Heart sounds: Normal heart sounds. No murmur.  Pulmonary:     Effort: Pulmonary effort is normal.     Breath sounds: Normal breath sounds. No wheezing or rales.  Chest:     Chest wall: No tenderness.  Abdominal:     General: Bowel sounds are normal. There is no distension.     Palpations: Abdomen is soft. There is no mass.     Tenderness: There is  no abdominal tenderness.  Musculoskeletal: Normal range of motion.  Neurological:     Mental Status: She is alert and oriented to person, place, and time.     Lab Results  Component Value Date   HGBA1C 11.2 (A) 10/14/2018    Assessment & Plan:   1. Uncontrolled type 2 diabetes mellitus with hyperglycemia (HCC) Uncontrolled with A1C of 11.2 Recently seen by PCP for same - POCT glucose (manual entry)  2. Anxiety Could explain chest pain Due to anxiety over loss of her Dad especially with the  Christmas She saw the LCSW at her last visit and is not open to doing so again - hydrOXYzine (ATARAX/VISTARIL) 25 MG tablet; Take 1 tablet (25 mg total) by mouth 3 (three) times daily as needed.  Dispense: 90 tablet; Refill: 1  3. Other chest pain Unremarkable EKG Likely fromanxiety   Meds ordered this encounter  Medications  . hydrOXYzine (ATARAX/VISTARIL) 25 MG tablet    Sig: Take 1 tablet (25 mg total) by mouth 3 (three) times daily as needed.    Dispense:  90 tablet    Refill:  1    Follow-up: No follow-ups on file.   Charlott Rakes MD

## 2018-11-25 NOTE — Progress Notes (Signed)
De NurseMarco -[phone]

## 2018-11-26 NOTE — Progress Notes (Signed)
This pt was a no show  

## 2018-11-28 ENCOUNTER — Ambulatory Visit: Payer: Self-pay

## 2019-01-06 ENCOUNTER — Ambulatory Visit: Payer: Self-pay | Attending: Internal Medicine

## 2019-01-17 ENCOUNTER — Telehealth: Payer: Self-pay | Admitting: Internal Medicine

## 2019-01-17 NOTE — Telephone Encounter (Signed)
I called pt to informed her that NEED TAX RETURN ON NON FILING LETTER WILL HOLD FOR 14 DAYS  , pt has been informed

## 2019-01-27 ENCOUNTER — Ambulatory Visit: Payer: Self-pay | Admitting: Internal Medicine

## 2019-01-29 ENCOUNTER — Telehealth: Payer: Self-pay | Admitting: *Deleted

## 2019-01-29 NOTE — Telephone Encounter (Signed)
Medical Assistant used [employer] to contact patient.  Interpreter Name: Phoebe Perch #: [id number] !!!Patient missed her 01/27/2019 appointment. Please ask the reason for no showing and to offer the next appointment!!!

## 2019-02-17 LAB — HM DIABETES EYE EXAM

## 2019-03-17 ENCOUNTER — Ambulatory Visit: Payer: Self-pay | Attending: Family Medicine | Admitting: Family Medicine

## 2019-03-17 ENCOUNTER — Other Ambulatory Visit: Payer: Self-pay | Admitting: Family Medicine

## 2019-03-17 ENCOUNTER — Other Ambulatory Visit: Payer: Self-pay

## 2019-03-17 ENCOUNTER — Encounter: Payer: Self-pay | Admitting: Family Medicine

## 2019-03-17 VITALS — BP 109/77 | HR 76 | Temp 98.2°F | Resp 18 | Ht 62.0 in | Wt 123.0 lb

## 2019-03-17 DIAGNOSIS — E1165 Type 2 diabetes mellitus with hyperglycemia: Secondary | ICD-10-CM

## 2019-03-17 DIAGNOSIS — R072 Precordial pain: Secondary | ICD-10-CM

## 2019-03-17 DIAGNOSIS — B373 Candidiasis of vulva and vagina: Secondary | ICD-10-CM

## 2019-03-17 DIAGNOSIS — B3731 Acute candidiasis of vulva and vagina: Secondary | ICD-10-CM

## 2019-03-17 DIAGNOSIS — N3001 Acute cystitis with hematuria: Secondary | ICD-10-CM

## 2019-03-17 DIAGNOSIS — Z1239 Encounter for other screening for malignant neoplasm of breast: Secondary | ICD-10-CM

## 2019-03-17 LAB — GLUCOSE, POCT (MANUAL RESULT ENTRY): POC Glucose: 295 mg/dL — AB (ref 70–99)

## 2019-03-17 MED ORDER — SULFAMETHOXAZOLE-TRIMETHOPRIM 800-160 MG PO TABS: 1.0000 | ORAL_TABLET | Freq: Two times a day (BID) | ORAL | 0 refills | Status: AC

## 2019-03-17 MED ORDER — GLIMEPIRIDE 4 MG PO TABS: 4.0000 mg | ORAL_TABLET | Freq: Every day | ORAL | 3 refills | Status: DC

## 2019-03-17 MED ORDER — TRUEPLUS LANCETS 28G MISC: 12 refills | Status: DC

## 2019-03-17 MED ORDER — TRUE METRIX METER W/DEVICE KIT: PACK | 0 refills | Status: DC

## 2019-03-17 MED ORDER — FLUCONAZOLE 150 MG PO TABS: 150.0000 mg | ORAL_TABLET | Freq: Once | ORAL | 0 refills | Status: AC

## 2019-03-17 MED ORDER — METFORMIN HCL 1000 MG PO TABS: 1000.0000 mg | ORAL_TABLET | Freq: Two times a day (BID) | ORAL | 6 refills | Status: DC

## 2019-03-17 MED ORDER — NYSTATIN 100000 UNIT/GM EX CREA: 1.0000 "application " | TOPICAL_CREAM | Freq: Three times a day (TID) | CUTANEOUS | 4 refills | Status: DC

## 2019-03-17 MED ORDER — GLUCOSE BLOOD VI STRP: ORAL_STRIP | 12 refills | Status: DC

## 2019-03-17 MED ORDER — OMEPRAZOLE 20 MG PO CPDR: 20.0000 mg | DELAYED_RELEASE_CAPSULE | Freq: Every day | ORAL | 1 refills | Status: DC

## 2019-03-17 NOTE — Progress Notes (Signed)
Established Patient Office Visit  Subjective:  Patient ID: Faith Alvarez, female    DOB: 02/08/1971  Age: 48 y.o. MRN: [id number]  CC: vaginal itching  HPI Faith Alvarez presents for complaint of vaginal itching. Patient is a 48 yo patient of Dr. Durenda Age with a history of uncontrolled Type 2 diabetes with last Hgb A1c of 11.2 on 10/14/18. Patient reports that she lost/misplaced her glucometer and therefore had not checked her BS in a while but then her husband was given a meter and when she has checked her BS within the past 2 weeks her BS has been in the 200's. She has had increased thirst, frequent urination and blurred vision at times over the past few months.  She reports that when she was last seen here a few months ago she had been told by the provider that she had a vaginal infection and that medication would be sent into the pharmacy for treatment however patient states that when she checked with the pharmacy there was no medication.  Patient therefore says that she has had vaginal itching for several months.  Patient has some dysuria with urination as she states that she sometimes scratches the skin in her vaginal area until bleeding occurs.  Patient believes that the dysuria is secondary to skin irritation.      Patient reports occasional discomfort in her mid upper abdomen/substernal/middle of the chest.  Patient occasionally feels that this pain then radiates to the right side of her chest.  Patient is not currently on any medication for acid reflux.  Patient does have burping/belching with certain foods as well as upper mid abdominal discomfort with certain foods.  Patient admits that she did not go for her last mammogram that was scheduled.  Patient has found no abnormalities on self breast exam.  Past Medical History:  Diagnosis Date  . Diabetes mellitus without complication ([place])    Family History  Problem Relation Age of Onset  . Diabetes Mother   .  Diabetes Sister    Social History   Tobacco Use  . Smoking status: Never Smoker  . Smokeless tobacco: Never Used  Substance Use Topics  . Alcohol use: Not on file  . Drug use: Not on file   No Known Allergies   Outpatient Medications Prior to Visit  Medication Sig Dispense Refill  . Blood Glucose Monitoring Suppl (TRUE METRIX METER) w/Device KIT Use as directed (Patient not taking: Reported on 10/14/2018) 1 kit 0  . glipiZIDE (GLUCOTROL) 10 MG tablet Take 1 tablet (10 mg total) by mouth daily before breakfast. 60 tablet 5  . glucose blood (TRUE METRIX BLOOD GLUCOSE TEST) test strip Use as instructed (Patient not taking: Reported on 10/14/2018) 100 each 12  . hydrOXYzine (ATARAX/VISTARIL) 25 MG tablet Take 1 tablet (25 mg total) by mouth 3 (three) times daily as needed. 90 tablet 1  . metFORMIN (GLUCOPHAGE) 1000 MG tablet Take 1 tablet (1,000 mg total) by mouth 2 (two) times daily with a meal. 60 tablet 6  . pravastatin (PRAVACHOL) 20 MG tablet Take 1 tablet (20 mg total) by mouth daily. 30 tablet 5  . sertraline (ZOLOFT) 50 MG tablet 1/2 PO daily x 2 wks then 1 tab daily. 30 tablet 1  . TRUEPLUS LANCETS 28G MISC Use as directed (Patient not taking: Reported on 10/14/2018) 100 each 12   No facility-administered medications prior to visit.     No Known Allergies  ROS Review of Systems  Constitutional: Positive for  fatigue (mild). Negative for chills and fever.  HENT: Negative for congestion, postnasal drip, sneezing, sore throat and trouble swallowing.   Eyes: Positive for visual disturbance (blurred vision- patient has seen her eye doctor). Negative for photophobia.  Respiratory: Negative for cough and shortness of breath.   Cardiovascular: Positive for chest pain (substernal and right chest).  Gastrointestinal: Negative for abdominal pain, blood in stool, constipation, diarrhea and nausea.  Endocrine: Positive for polydipsia and polyuria. Negative for polyphagia.  Genitourinary:  Positive for frequency and vaginal discharge (white vaginal discharge at times). Negative for dysuria and pelvic pain.  Musculoskeletal: Negative for arthralgias and back pain.  Allergic/Immunologic: Negative for environmental allergies and food allergies.  Neurological: Negative for dizziness and headaches.  Hematological: Negative for adenopathy. Does not bruise/bleed easily.      Objective:    Physical Exam  Constitutional: She is oriented to person, place, and time. She appears well-developed and well-nourished. No distress.  Neck: Normal range of motion. Neck supple.  Cardiovascular: Normal rate and regular rhythm.  Pulmonary/Chest: Effort normal and breath sounds normal.  Abdominal: Soft. There is abdominal tenderness (Mild suprapubic discomfort to palpation). There is no rebound and no guarding.  Patient denies tenderness on exam but states that she does sometimes have pain in the mid upper abdomen  Genitourinary:    Genitourinary Comments: No CVA tenderness   Musculoskeletal: Normal range of motion.        General: No tenderness or edema.  Lymphadenopathy:    She has no cervical adenopathy.  Neurological: She is alert and oriented to person, place, and time.  Skin: Skin is warm and dry.  Psychiatric: She has a normal mood and affect. Her behavior is normal. Judgment and thought content normal.  Nursing note and vitals reviewed.   There were no vitals taken for this visit. Wt Readings from Last 3 Encounters:  11/25/18 129 lb 6.4 oz (58.7 kg)  10/14/18 126 lb 6.4 oz (57.3 kg)  04/17/18 123 lb 12.8 oz (56.2 kg)     Health Maintenance Due  Topic Date Due  . FOOT EXAM  07/16/2018    There are no preventive care reminders to display for this patient.  No results found for: TSH Lab Results  Component Value Date   WBC 7.8 04/17/2018   HGB 13.8 04/17/2018   HCT 41.8 04/17/2018   MCV 93 04/17/2018   PLT 280 04/17/2018   Lab Results  Component Value Date   NA 135  04/17/2018   K 3.9 04/17/2018   CO2 22 04/17/2018   GLUCOSE 178 (H) 04/17/2018   BUN 12 04/17/2018   CREATININE 0.42 (L) 04/17/2018   BILITOT 0.2 04/17/2018   ALKPHOS 97 04/17/2018   AST 12 04/17/2018   ALT 7 04/17/2018   PROT 7.1 04/17/2018   ALBUMIN 4.3 04/17/2018   CALCIUM 9.1 04/17/2018   ANIONGAP 8 04/30/2017   Lab Results  Component Value Date   CHOL 260 (H) 07/16/2017   Lab Results  Component Value Date   HDL 50 07/16/2017   Lab Results  Component Value Date   LDLCALC 146 (H) 07/16/2017   Lab Results  Component Value Date   TRIG 318 (H) 07/16/2017   Lab Results  Component Value Date   CHOLHDL 5.2 (H) 07/16/2017   Lab Results  Component Value Date   HGBA1C 11.2 (A) 10/14/2018      Assessment & Plan:   1. Uncontrolled type 2 diabetes mellitus with hyperglycemia, no long term use of  insulin Patient's last hemoglobin A1c was elevated at 11.2.  Patient reports that she was unable to use her glucometer to check her blood sugars.  Patient is given prescription for new glucometer along with testing supplies and is encouraged to check her sugars on a regular basis.  Patient per medication list was on metformin as well as Glucotrol however she has not been taking the Glucotrol per her report.  New prescription will be sent to patient's pharmacy for Amaryl/glimepiride 4 mg once daily before the first meal of the day.  Patient will also continue metformin.  She was asked to follow-up with her PCP in the next 3 to 4 weeks in follow-up of her uncontrolled diabetes - Glucose (CBG) - HgB A1c - metFORMIN (GLUCOPHAGE) 1000 MG tablet; Take 1 tablet (1,000 mg total) by mouth 2 (two) times daily with a meal.  Dispense: 60 tablet; Refill: 6 - Blood Glucose Monitoring Suppl (TRUE METRIX METER) w/Device KIT; Use as directed  Dispense: 1 kit; Refill: 0 - glucose blood (TRUE METRIX BLOOD GLUCOSE TEST) test strip; Use as instructed  Dispense: 100 each; Refill: 12 - TRUEplus Lancets 28G  MISC; Use as directed  Dispense: 100 each; Refill: 12 - glimepiride (AMARYL) 4 MG tablet; Take 1 tablet (4 mg total) by mouth daily before breakfast. Or before the first meal of the day  Dispense: 30 tablet; Refill: 3 - Basic Metabolic Panel  2. Substernal chest pain Patient with complaint of substernal chest pain which likely is related to acid reflux as patient also with complaint of upper mid abdominal pain and both substernal chest pain and mid upper abdominal pain are related to eating and are somewhat relieved with belching/burping.  Prescription provided for omeprazole 20 mg once daily and patient was asked to avoid spicy/greasy foods.  Patient also provided with information to obtain mammogram as she states that she sometimes has right-sided chest pain along with the substernal chest pain - omeprazole (PRILOSEC) 20 MG capsule; Take 1 capsule (20 mg total) by mouth daily. To reduce stomach acid  Dispense: 90 capsule; Refill: 1  3. Candidal vaginitis Patient with complaint of vaginal itching and believes that she has a vaginal yeast infection.  Prescription provided for Diflucan to take 150 mg x 1 and repeat in 3 days and prescription provided for nystatin cream.  Patient should return if her symptoms do not resolve within the next 2 weeks - fluconazole (DIFLUCAN) 150 MG tablet; Take 1 tablet (150 mg total) by mouth once for 1 dose. Repeat in 3 days  Dispense: 2 tablet; Refill: 0 - nystatin cream (MYCOSTATIN); Apply 1 application topically 3 (three) times daily. X 7 days then use as needed for itching  Dispense: 30 g; Refill: 4  5. Acute cystitis with hematuria She reports burning with urination as well as suprapubic discomfort.  Urinalysis obtained and results are pending.  Patient will be placed on Septra double strength twice daily x3 days in case of urinary tract infection.  Patient should call or return if her symptoms do not improve  6. Screening for breast cancer CMA was asked to  provide scholarship information for patient to apply for mammogram for which patient is due - MM Digital Screening; Future    Follow-up: Return in about 3 weeks (around 04/07/2019) for DM-3 weeks with Dr. Wynetta Emery.   Antony Blackbird, MD

## 2019-03-18 LAB — BASIC METABOLIC PANEL WITH GFR
BUN/Creatinine Ratio: 25 — ABNORMAL HIGH (ref 9–23)
BUN: 13 mg/dL (ref 6–24)
CO2: 22 mmol/L (ref 20–29)
Calcium: 9.2 mg/dL (ref 8.7–10.2)
Chloride: 98 mmol/L (ref 96–106)
Creatinine, Ser: 0.53 mg/dL — ABNORMAL LOW (ref 0.57–1.00)
GFR calc Af Amer: 132 mL/min/1.73
GFR calc non Af Amer: 114 mL/min/1.73
Glucose: 300 mg/dL — ABNORMAL HIGH (ref 65–99)
Potassium: 4 mmol/L (ref 3.5–5.2)
Sodium: 133 mmol/L — ABNORMAL LOW (ref 134–144)

## 2019-03-19 ENCOUNTER — Telehealth: Payer: Self-pay | Admitting: *Deleted

## 2019-03-19 NOTE — Telephone Encounter (Signed)
-----   Message from Cain Saupe, MD sent at 03/18/2019  5:55 PM EDT ----- Blood sugar high at 300-very important that she takes her prescribed medications, low carb diet and monitor her blood sugars and call the office if blood sugars stay above 180 fasting

## 2019-03-19 NOTE — Telephone Encounter (Signed)
Medical Assistant used [employer] to contact patient.  Interpreter Name: Berneda Rose #: [id number] Patient was not available, [employer] left patient a voicemail. Patient is aware of needing to take medication has prescribed and to eat a low fat diet.

## 2019-03-20 ENCOUNTER — Telehealth: Payer: Self-pay | Admitting: *Deleted

## 2019-03-20 DIAGNOSIS — E1165 Type 2 diabetes mellitus with hyperglycemia: Secondary | ICD-10-CM

## 2019-03-20 MED ORDER — TRUE METRIX METER W/DEVICE KIT: PACK | 0 refills | Status: DC

## 2019-03-20 NOTE — Telephone Encounter (Signed)
Patient DM supplies were sent to [facility] electronically to be mailed.

## 2019-06-02 ENCOUNTER — Ambulatory Visit: Payer: Self-pay | Admitting: Internal Medicine

## 2019-07-28 ENCOUNTER — Other Ambulatory Visit: Payer: Self-pay | Admitting: Pharmacist

## 2019-07-28 ENCOUNTER — Encounter: Payer: Self-pay | Admitting: Nurse Practitioner

## 2019-07-28 ENCOUNTER — Other Ambulatory Visit: Payer: Self-pay

## 2019-07-28 ENCOUNTER — Ambulatory Visit: Payer: Self-pay | Attending: Nurse Practitioner | Admitting: Nurse Practitioner

## 2019-07-28 VITALS — BP 121/81 | HR 67 | Temp 98.7°F | Ht 62.0 in | Wt 127.0 lb

## 2019-07-28 DIAGNOSIS — F321 Major depressive disorder, single episode, moderate: Secondary | ICD-10-CM

## 2019-07-28 DIAGNOSIS — E1165 Type 2 diabetes mellitus with hyperglycemia: Secondary | ICD-10-CM

## 2019-07-28 DIAGNOSIS — F419 Anxiety disorder, unspecified: Secondary | ICD-10-CM

## 2019-07-28 DIAGNOSIS — E782 Mixed hyperlipidemia: Secondary | ICD-10-CM

## 2019-07-28 DIAGNOSIS — E1141 Type 2 diabetes mellitus with diabetic mononeuropathy: Secondary | ICD-10-CM

## 2019-07-28 DIAGNOSIS — K219 Gastro-esophageal reflux disease without esophagitis: Secondary | ICD-10-CM

## 2019-07-28 LAB — POCT GLYCOSYLATED HEMOGLOBIN (HGB A1C): Hemoglobin A1C: 11.7 % — AB (ref 4.0–5.6)

## 2019-07-28 LAB — GLUCOSE, POCT (MANUAL RESULT ENTRY): POC Glucose: 377 mg/dl — AB (ref 70–99)

## 2019-07-28 MED ORDER — OMEPRAZOLE 20 MG PO CPDR: 20.0000 mg | DELAYED_RELEASE_CAPSULE | Freq: Every day | ORAL | 1 refills | Status: DC

## 2019-07-28 MED ORDER — INSULIN DETEMIR 100 UNIT/ML FLEXPEN: 10.0000 [IU] | PEN_INJECTOR | Freq: Every day | SUBCUTANEOUS | 11 refills | Status: DC

## 2019-07-28 MED ORDER — PRAVASTATIN SODIUM 20 MG PO TABS: 20.0000 mg | ORAL_TABLET | Freq: Every day | ORAL | 2 refills | Status: DC

## 2019-07-28 MED ORDER — METFORMIN HCL 1000 MG PO TABS: 1000.0000 mg | ORAL_TABLET | Freq: Two times a day (BID) | ORAL | 6 refills | Status: DC

## 2019-07-28 MED ORDER — ESCITALOPRAM OXALATE 10 MG PO TABS: 10.0000 mg | ORAL_TABLET | Freq: Every day | ORAL | 2 refills | Status: DC

## 2019-07-28 MED ORDER — GABAPENTIN 100 MG PO CAPS: 100.0000 mg | ORAL_CAPSULE | Freq: Every day | ORAL | 3 refills | Status: DC

## 2019-07-28 MED ORDER — TRUE METRIX METER W/DEVICE KIT: PACK | 0 refills | Status: DC

## 2019-07-28 MED ORDER — TRUEPLUS LANCETS 28G MISC: 12 refills | Status: DC

## 2019-07-28 MED ORDER — GLIMEPIRIDE 4 MG PO TABS: 4.0000 mg | ORAL_TABLET | Freq: Every day | ORAL | 3 refills | Status: DC

## 2019-07-28 MED ORDER — BD PEN NEEDLE MINI U/F 31G X 5 MM MISC: 1 refills | Status: DC

## 2019-07-28 MED ORDER — TRUE METRIX BLOOD GLUCOSE TEST VI STRP: ORAL_STRIP | 12 refills | Status: DC

## 2019-07-28 MED ORDER — HYDROXYZINE HCL 25 MG PO TABS: 25.0000 mg | ORAL_TABLET | Freq: Three times a day (TID) | ORAL | 1 refills | Status: DC | PRN

## 2019-07-28 MED ORDER — LANTUS SOLOSTAR 100 UNIT/ML ~~LOC~~ SOPN: 10.0000 [IU] | PEN_INJECTOR | Freq: Every day | SUBCUTANEOUS | 0 refills | Status: DC

## 2019-07-28 NOTE — Patient Instructions (Signed)
Behavioral Health Resources:  ? ?What if I or someone I know is in crisis? ? ?If you are thinking about harming yourself or having thoughts of suicide, or if you know someone who is, seek help right away. ? ?Call your doctor or mental health care provider. ? ?Call 911 or go to a hospital emergency room to get immediate help, or ask a friend or family member to help you do these things. ? ?Call the [place] National Suicide Prevention Lifeline?s toll-free, 24-hour hotline at 1-800-273-TALK (1-800-273-8255) or TTY: 1-800-799-4 TTY (1-800-799-4889) to talk to a trained counselor. ? ?If you are in crisis, make sure you are not left alone.  ? ?If someone else is in crisis, make sure he or she is not left alone ? ? ?24 Hour Availability ? ?[facility]  ?[address], [place], [place] [address]  ?336-832-9700 or 1-800-711-2635 ? ?[facility] Crisis Line ?(Domestic Violence, Rape & Victim Assistance ?336-273-7273 ? ?[facility]  ?[address] [place], [place]  [address]               1-855-788-8787 or 336-676-6840 ? ?[facility] [place] [facility]    ?(ONLY from 8am-4pm)    ?336-899-1505 ? ?[facility] (24/7)   ?1-877-626-1772 ? ?[place] National Suicide Hotline   ?1-800-273-8255 (TALK) ? ?Support from local police to aid getting patient to hospital (http://www.[place].gov/index.aspx?page=2797) ? ? ?      ?ONGOING BEHAVIORAL HEALTH SUPPORT FOR UNINSURED and UNDERINSURED:  ?[facility]  ?336-676-6840  ?[address]  ?Walk-in first time, Monday-Friday, 8:30am-5:00pm  ?*Bring snack, drink, something to do, long wait at first visit, they do have pharmacy for behavioral health medications/ Bring own interpreter at first visit, if needed ?[facility]  ?336-387-6161  ?[address]  ?Walk-in Monday-Friday, 8:30am-12pm & 1-2:30pm  ?*pacientes que hablen espanol, favor comunicarse con el Sr.  [place], extension 2244 o la [address], extension 3331 para hacer una cita  ?[facility]:  ?336-429-5600 or kell[facility]ound[facility]@gmail.com  ?[address], [address]  ?Call or email, may self-refer  ?* uninsured/underinsured, 19-64yo, have both mental health and substance use challenges  ?[facility]:  ?Phone ([phone]) [phone]; Fax [phone]  ?*Call to schedule an appointment  ?3rd Floor located @?[address], [address] of [address]  ?Mon-Thursday: 8:30am-8:00pm Friday: 8:30am-7:00pm  ?* Be sure to park in a space labeled ?Psychology Department,? located to the right of the main door of the building. Enter the main doors facing the parking lot and take the elevator or stairs to the 3rd Floor.  ?[facility]:  ?336-832-9700 or  ?1-800-711-2635 (24/hour helpline)  ?[address]  ?Call to make appointment, tends to be a long wait to begin services, depending on insurance  ?[facility]  ?(336) 333-6860 ??  ?*Call to schedule an appointment  ?[address]  ?Monday-Friday, 8:00am-5:00pm  ?[facility]  ?([phone]) [phone]  ?[address], [place]  ?Monday-Friday, walk-in 8am-3pm  ?First appointment is assessment, then will make appointment for psychiatry   ?  ?

## 2019-07-28 NOTE — Progress Notes (Signed)
Assessment & Plan:  [name] was seen today for diabetes.  Diagnoses and all orders for this visit:  She states she is not aware what most of her medications are being prescribed for. Each of her medications on her AVS now have the diagnosis written on them. I requested her sister transpose them over to spanish so she will know why she is taking them.   Uncontrolled type 2 diabetes mellitus with hyperglycemia, without long-term current use of insulin (HCC) -     Glucose (CBG) -     HgB A1c -     CBC -     Cancel: Basic metabolic panel -     Lipid panel -     metFORMIN (GLUCOPHAGE) 1000 MG tablet; Take 1 tablet (1,000 mg total) by mouth 2 (two) times daily with a meal. -     glimepiride (AMARYL) 4 MG tablet; Take 1 tablet (4 mg total) by mouth daily before breakfast. Or before the first meal of the day -     glucose blood (TRUE METRIX BLOOD GLUCOSE TEST) test strip; Use as instructed -     TRUEplus Lancets 28G MISC; Use as directed -     Blood Glucose Monitoring Suppl (TRUE METRIX METER) w/Device KIT; Use as directed -     Insulin Pen Needle (B-D UF III MINI PEN NEEDLES) 31G X 5 MM MISC; Use as instructed. Inject into the skin once nightly. -     CMP14+EGFR Continue blood sugar control as discussed in office today, low carbohydrate diet, and regular physical exercise as tolerated, 150 minutes per week (30 min each day, 5 days per week, or 50 min 3 days per week). Keep blood sugar logs with fasting goal of 90-130 mg/dl, post prandial (after you eat) less than 180.  For Hypoglycemia: BS <60 and Hyperglycemia BS >400; contact the clinic ASAP. Annual eye exams and foot exams are recommended.  Diabetes is poorly controlled. Advised patient to keep a fasting blood sugar log fast, 2 hours post lunch and bedtime which will be reviewed at the next office visit.  Mixed hyperlipidemia -     pravastatin (PRAVACHOL) 20 MG tablet; Take 1 tablet (20 mg total) by mouth daily INSTRUCTIONS: Work on a  low fat, heart healthy diet and participate in regular aerobic exercise program by working out at least 150 minutes per week; 5 days a week-30 minutes per day. Avoid red meat, fried foods. junk foods, sodas, sugary drinks, unhealthy snacking, alcohol and smoking.  Drink at least 48oz of water per day and monitor your carbohydrate intake daily.    Gastroesophageal reflux disease, esophagitis presence not specified -     omeprazole (PRILOSEC) 20 MG capsule; Take 1 capsule (20 mg total) by mouth daily. To reduce stomach acid INSTRUCTIONS: Avoid GERD Triggers: acidic, spicy or fried foods, caffeine, coffee, sodas,  alcohol and chocolate.   Anxiety -     hydrOXYzine (ATARAX/VISTARIL) 25 MG tablet; Take 1 tablet (25 mg total) by mouth 3 (three) times daily as needed.  Anxiety and depression -     escitalopram (LEXAPRO) 10 MG tablet; Take 1 tablet (10 mg total) by mouth daily. Handout on AVS given of local behavioral health clinics   Diabetic mononeuropathy associated with type 2 diabetes mellitus ([place]) -     gabapentin (NEURONTIN) 100 MG capsule; Take 1 capsule (100 mg total) by mouth at bedtime.   Patient has been counseled on age-appropriate routine health concerns for screening and prevention. These are  reviewed and up-to-date. Referrals have been placed accordingly. Immunizations are up-to-date or declined.    Subjective:   Chief Complaint  Patient presents with  . Diabetes    pt is here for diabetes.    HPI Faith Alvarez 48 y.o. female presents to office today for follow up. She has been noncompliant with taking any of her medications. She has her sister here today interpreting for her today. She has not seen her primary care since last year. Most of her medications have expired.    has a past medical history of Anxiety, Depression, Diabetes mellitus without complication ([place]), Diabetic neuropathy ([place]), GERD (gastroesophageal reflux disease), and Hyperlipidemia.   DM  TYPE 2 She is noncompliant with taking medications. She cant remember the last time she checked her blood glucose. Stating she lost her meter. Noncompliant with diet as well. A1c not at goal. She has neuropathy. Will be adding Levemir 10 units today and refill of gabapentin. She still has an almost full bottle of metformin and glimepiride today. Already prescribed statin. LDL not at goal of <70. She denies any statin intolerance or myalgias. She is very reluctant with taking lantus. I explained to her if she works hard with diet and medication compliance taking her metformin and amaryl she could possibly come off lantus.  Lab Results  Component Value Date   LDLCALC 146 (H) 07/16/2017   Lab Results  Component Value Date   HGBA1C 11.7 (A) 07/28/2019   Blood pressure is well controlled.  BP Readings from Last 3 Encounters:  07/28/19 121/81  03/17/19 109/77  11/25/18 127/72   GERD: Patient complains of heartburn. This has been associated with heartburn and midespigastric pain.  She denies hematemesis, melena, regurgitation of undigested food and symptoms primarily relate to meals, and lying down after meals. Symptoms have been present for several months. She denies dysphagia.  She has not lost weight. She denies melena, hematochezia, hematemesis, and coffee ground emesis. Medical therapy in the past has included proton pump inhibitors which she has been noncompliant with taking.   Anxiety and Depression  States she did not like the way zoloft made her feel so she stopped taking it. Will prescribe lexapro and she will continue on hydroxyzine for anxiety. Denies any current thoughts of self harm   Review of Systems  Constitutional: Negative for fever, malaise/fatigue and weight loss.  HENT: Negative.  Negative for nosebleeds.   Eyes: Negative.  Negative for blurred vision, double vision and photophobia.  Respiratory: Negative.  Negative for cough and shortness of breath.   Cardiovascular:  Negative.  Negative for chest pain, palpitations and leg swelling.  Gastrointestinal: Positive for heartburn. Negative for nausea and vomiting.  Musculoskeletal: Negative.  Negative for myalgias.  Skin: Positive for itching (48 year old C section incision).  Neurological: Positive for sensory change (neuropathy). Negative for dizziness, focal weakness, seizures and headaches.  Psychiatric/Behavioral: Positive for depression. Negative for suicidal ideas. The patient is nervous/anxious.     Past Medical History:  Diagnosis Date  . Anxiety   . Depression   . Diabetes mellitus without complication ([place])   . Diabetic neuropathy ([place])   . GERD (gastroesophageal reflux disease)   . Hyperlipidemia     History reviewed. No pertinent surgical history.  Family History  Problem Relation Age of Onset  . Diabetes Mother   . Diabetes Sister     Social History Reviewed with no changes to be made today.   Outpatient Medications Prior to Visit  Medication  Sig Dispense Refill  . glimepiride (AMARYL) 4 MG tablet Take 1 tablet (4 mg total) by mouth daily before breakfast. Or before the first meal of the day 30 tablet 3  . hydrOXYzine (ATARAX/VISTARIL) 25 MG tablet Take 1 tablet (25 mg total) by mouth 3 (three) times daily as needed. 90 tablet 1  . omeprazole (PRILOSEC) 20 MG capsule Take 1 capsule (20 mg total) by mouth daily. To reduce stomach acid 90 capsule 1  . nystatin cream (MYCOSTATIN) Apply 1 application topically 3 (three) times daily. X 7 days then use as needed for itching (Patient not taking: Reported on 07/28/2019) 30 g 4  . Blood Glucose Monitoring Suppl (TRUE METRIX METER) w/Device KIT Use as directed (Patient not taking: Reported on 07/28/2019) 1 kit 0  . glucose blood (TRUE METRIX BLOOD GLUCOSE TEST) test strip Use as instructed (Patient not taking: Reported on 07/28/2019) 100 each 12  . metFORMIN (GLUCOPHAGE) 1000 MG tablet Take 1 tablet (1,000 mg total) by mouth 2 (two) times daily  with a meal. 60 tablet 6  . pravastatin (PRAVACHOL) 20 MG tablet Take 1 tablet (20 mg total) by mouth daily. (Patient not taking: Reported on 07/28/2019) 30 tablet 5  . sertraline (ZOLOFT) 50 MG tablet 1/2 PO daily x 2 wks then 1 tab daily. (Patient not taking: Reported on 07/28/2019) 30 tablet 1  . TRUEplus Lancets 28G MISC Use as directed (Patient not taking: Reported on 07/28/2019) 100 each 12   No facility-administered medications prior to visit.     No Known Allergies     Objective:    BP 121/81 (BP Location: Right Arm, Patient Position: Sitting, Cuff Size: Normal)   Pulse 67   Temp 98.7 F (37.1 C) (Oral)   Ht 5' 2"  (1.575 m)   Wt 127 lb (57.6 kg)   SpO2 99%   BMI 23.23 kg/m  Wt Readings from Last 3 Encounters:  07/28/19 127 lb (57.6 kg)  03/17/19 123 lb (55.8 kg)  11/25/18 129 lb 6.4 oz (58.7 kg)    Physical Exam Vitals signs and nursing note reviewed.  Constitutional:      Appearance: She is well-developed.  HENT:     Head: Normocephalic and atraumatic.  Neck:     Musculoskeletal: Normal range of motion.  Cardiovascular:     Rate and Rhythm: Normal rate and regular rhythm.     Heart sounds: Normal heart sounds. No murmur. No friction rub. No gallop.   Pulmonary:     Effort: Pulmonary effort is normal. No tachypnea or respiratory distress.     Breath sounds: Normal breath sounds. No decreased breath sounds, wheezing, rhonchi or rales.  Chest:     Chest wall: No tenderness.  Abdominal:     General: Bowel sounds are normal.     Palpations: Abdomen is soft.    Musculoskeletal: Normal range of motion.  Skin:    General: Skin is warm and dry.     Findings: No rash.  Neurological:     Mental Status: She is alert and oriented to person, place, and time.     Coordination: Coordination normal.  Psychiatric:        Attention and Perception: Attention normal.        Mood and Affect: Mood normal.        Speech: Speech normal.        Behavior: Behavior normal.  Behavior is cooperative.        Thought Content: Thought content normal.  Judgment: Judgment normal.       Patient has been counseled extensively about nutrition and exercise as well as the importance of adherence with medications and regular follow-up. The patient was given clear instructions to go to ER or return to medical center if symptoms don't improve, worsen or new problems develop. The patient verbalized understanding.   Follow-up: Return in about 6 weeks (around 09/08/2019) for 6 weeks with luke 3 months with Dr. Wynetta Emery.   Gildardo Pounds, FNP-BC [facility] [place], [place]   07/28/2019, 9:19 PM

## 2019-07-29 LAB — LIPID PANEL
Chol/HDL Ratio: 7.6 ratio — ABNORMAL HIGH (ref 0.0–4.4)
Cholesterol, Total: 290 mg/dL — ABNORMAL HIGH (ref 100–199)
HDL: 38 mg/dL — ABNORMAL LOW (ref >39)
Triglycerides: 562 mg/dL (ref 0–149)

## 2019-07-29 LAB — CMP14+EGFR
ALT: 12 IU/L (ref 0–32)
AST: 12 IU/L (ref 0–40)
Albumin/Globulin Ratio: 1.6 (ref 1.2–2.2)
Albumin: 4.1 g/dL (ref 3.8–4.8)
Alkaline Phosphatase: 117 IU/L (ref 39–117)
BUN/Creatinine Ratio: 17 (ref 9–23)
BUN: 11 mg/dL (ref 6–24)
Bilirubin Total: 0.3 mg/dL (ref 0.0–1.2)
CO2: 26 mmol/L (ref 20–29)
Calcium: 9.2 mg/dL (ref 8.7–10.2)
Chloride: 99 mmol/L (ref 96–106)
Creatinine, Ser: 0.64 mg/dL (ref 0.57–1.00)
GFR calc Af Amer: 124 mL/min/{1.73_m2} (ref >59)
GFR calc non Af Amer: 107 mL/min/{1.73_m2} (ref >59)
Globulin, Total: 2.6 g/dL (ref 1.5–4.5)
Glucose: 309 mg/dL — ABNORMAL HIGH (ref 65–99)
Potassium: 4.5 mmol/L (ref 3.5–5.2)
Sodium: 136 mmol/L (ref 134–144)
Total Protein: 6.7 g/dL (ref 6.0–8.5)

## 2019-07-29 LAB — CBC
Hematocrit: 47.1 % — ABNORMAL HIGH (ref 34.0–46.6)
Hemoglobin: 15 g/dL (ref 11.1–15.9)
MCH: 31.1 pg (ref 26.6–33.0)
MCHC: 31.8 g/dL (ref 31.5–35.7)
MCV: 98 fL — ABNORMAL HIGH (ref 79–97)
Platelets: 319 10*3/uL (ref 150–450)
RBC: 4.82 x10E6/uL (ref 3.77–5.28)
RDW: 12.5 % (ref 11.7–15.4)
WBC: 6.1 10*3/uL (ref 3.4–10.8)

## 2019-08-04 ENCOUNTER — Other Ambulatory Visit: Payer: Self-pay | Admitting: Nurse Practitioner

## 2019-08-04 MED ORDER — OMEGA-3-ACID ETHYL ESTERS 1 G PO CAPS: 2.0000 g | ORAL_CAPSULE | Freq: Two times a day (BID) | ORAL | 2 refills | Status: DC

## 2019-08-08 ENCOUNTER — Ambulatory Visit: Payer: Self-pay

## 2019-09-08 ENCOUNTER — Ambulatory Visit: Payer: Self-pay | Admitting: Pharmacist

## 2019-10-15 ENCOUNTER — Telehealth: Payer: Self-pay | Admitting: Internal Medicine

## 2019-10-15 NOTE — Telephone Encounter (Signed)
Will forward to pcp

## 2019-10-15 NOTE — Telephone Encounter (Signed)
Yahir may you please contact pt and schedule an appointment  

## 2019-10-15 NOTE — Telephone Encounter (Signed)
Patient called stating what she was taking her DM medication and insulin but she stopped taking it because it made her dizzy. Patient states she no longer takes her insulin and would like advise from her PCP. Please f/u

## 2019-10-16 ENCOUNTER — Encounter: Payer: Self-pay | Admitting: Internal Medicine

## 2019-10-16 ENCOUNTER — Other Ambulatory Visit: Payer: Self-pay

## 2019-10-16 ENCOUNTER — Ambulatory Visit: Payer: Self-pay | Attending: Internal Medicine | Admitting: Licensed Clinical Social Worker

## 2019-10-16 ENCOUNTER — Ambulatory Visit (HOSPITAL_BASED_OUTPATIENT_CLINIC_OR_DEPARTMENT_OTHER): Payer: Self-pay | Admitting: Pharmacist

## 2019-10-16 ENCOUNTER — Ambulatory Visit: Payer: Self-pay | Attending: Internal Medicine | Admitting: Internal Medicine

## 2019-10-16 VITALS — BP 118/79 | HR 75 | Temp 99.3°F | Resp 16 | Wt 125.4 lb

## 2019-10-16 DIAGNOSIS — F32A Depression, unspecified: Secondary | ICD-10-CM | POA: Insufficient documentation

## 2019-10-16 DIAGNOSIS — F419 Anxiety disorder, unspecified: Secondary | ICD-10-CM

## 2019-10-16 DIAGNOSIS — Z23 Encounter for immunization: Secondary | ICD-10-CM

## 2019-10-16 DIAGNOSIS — E782 Mixed hyperlipidemia: Secondary | ICD-10-CM

## 2019-10-16 DIAGNOSIS — E1165 Type 2 diabetes mellitus with hyperglycemia: Secondary | ICD-10-CM

## 2019-10-16 DIAGNOSIS — F321 Major depressive disorder, single episode, moderate: Secondary | ICD-10-CM

## 2019-10-16 DIAGNOSIS — F329 Major depressive disorder, single episode, unspecified: Secondary | ICD-10-CM

## 2019-10-16 LAB — POCT GLYCOSYLATED HEMOGLOBIN (HGB A1C): HbA1c, POC (controlled diabetic range): 11.6 % — AB (ref 0.0–7.0)

## 2019-10-16 LAB — GLUCOSE, POCT (MANUAL RESULT ENTRY): POC Glucose: 315 mg/dl — AB (ref 70–99)

## 2019-10-16 MED ORDER — PRAVASTATIN SODIUM 20 MG PO TABS: 20.0000 mg | ORAL_TABLET | Freq: Every day | ORAL | 2 refills | Status: DC

## 2019-10-16 MED ORDER — SERTRALINE HCL 50 MG PO TABS: ORAL_TABLET | ORAL | 1 refills | Status: DC

## 2019-10-16 MED ORDER — GLIMEPIRIDE 4 MG PO TABS: 4.0000 mg | ORAL_TABLET | Freq: Every day | ORAL | 3 refills | Status: DC

## 2019-10-16 MED ORDER — METFORMIN HCL 1000 MG PO TABS: 1000.0000 mg | ORAL_TABLET | Freq: Two times a day (BID) | ORAL | 6 refills | Status: DC

## 2019-10-16 NOTE — Progress Notes (Signed)
Patient presents for vaccination against influenza per orders of Dr. Johnson. Consent given. Counseling provided. No contraindications exists. Vaccine administered without incident.   

## 2019-10-16 NOTE — Progress Notes (Signed)
Patient ID: Faith Alvarez, female    DOB: [birth year]  MRN: [id number]  CC: Diabetes   Subjective: Faith Alvarez is a 48 y.o. female who presents for chronic ds management.  Daughter is with her. Her concerns today include:  DM, anx/dep, HL, noncompliance with medications  DM: She was on Metformin and Lantus.  She stopped taking meds 2 mths ago because she was feeling dizzy and was having HA when she took her medicines. -endorses polyuria and polydipsia, blurred vision sometimes She has not check BS in 1 mth.  A1c today is 11.6.  Last 3 A1c's have been in this range.  HL: She stopped taking atorvastatin 2 months ago when she stopped her diabetes medicines.  She told me she just stopped taking all of her medications  Anx/dep: She is having some increased life stresses that are making her feel more anxious and depressed.  She was prescribed Lexapro and hydroxyzine but she stopped taking both of them 2 months ago also.  She felt the Lexapro made her feel more anxious.  She denies any suicidal ideation Patient Active Problem List   Diagnosis Date Noted  . Current moderate episode of major depressive disorder ([place]) 10/14/2018  . 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 Grief reaction 10/22/2017  . Diabetic mononeuropathy associated with type 2 diabetes mellitus ([place]) 10/22/2017  . Anxiety 10/22/2017  . Uncontrolled type 2 diabetes mellitus ([place]) 09/04/2017  . Mixed hyperlipidemia 07/25/2017  . Malaise and fatigue 04/01/2017     Current Outpatient Medications on File Prior to Visit  Medication Sig Dispense Refill  . Blood Glucose Monitoring Suppl (TRUE METRIX METER) w/Device KIT Use as directed 1 kit 0  . gabapentin (NEURONTIN) 100 MG capsule Take 1 capsule (100 mg total) by mouth at bedtime. 90 capsule 3  . glucose blood (TRUE METRIX BLOOD GLUCOSE TEST) test strip Use as instructed 100 each 12  . hydrOXYzine (ATARAX/VISTARIL) 25 MG tablet  Take 1 tablet (25 mg total) by mouth 3 (three) times daily as needed. 90 tablet 1  . Insulin Pen Needle (B-D UF III MINI PEN NEEDLES) 31G X 5 MM MISC Use as instructed. Inject into the skin once nightly. 100 each 1  . omega-3 acid ethyl esters (LOVAZA) 1 g capsule Take 2 capsules (2 g total) by mouth 2 (two) times daily. 120 capsule 2  . omeprazole (PRILOSEC) 20 MG capsule Take 1 capsule (20 mg total) by mouth daily. To reduce stomach acid 90 capsule 1  . TRUEplus Lancets 28G MISC Use as directed 100 each 12   No current facility-administered medications on file prior to visit.     No Known Allergies  Social History   Socioeconomic History  . Marital status: Married    Spouse name: Not on file  . Number of children: Not on file  . Years of education: Not on file  . Highest education level: Not on file  Occupational History  . Not on file  Social Needs  . Financial resource strain: Not on file  . Food insecurity    Worry: Not on file    Inability: Not on file  . Transportation needs    Medical: Not on file    Non-medical: Not on file  Tobacco Use  . Smoking status: Never Smoker  . Smokeless tobacco: Never Used  Substance and Sexual Activity  . Alcohol use: Never    Frequency: Never  . Drug use: Never  . Sexual activity:  Yes  Lifestyle  . Physical activity    Days per week: Not on file    Minutes per session: Not on file  . Stress: Not on file  Relationships  . Social Herbalist on phone: Not on file    Gets together: Not on file    Attends religious service: Not on file    Active member of club or organization: Not on file    Attends meetings of clubs or organizations: Not on file    Relationship status: Not on file  . Intimate partner violence    Fear of current or ex partner: Not on file    Emotionally abused: Not on file    Physically abused: Not on file    Forced sexual activity: Not on file  Other Topics Concern  . Not on file  Social History  Narrative  . Not on file    Family History  Problem Relation Age of Onset  . Diabetes Mother   . Diabetes Sister     No past surgical history on file.  ROS: Review of Systems Negative except as stated above  PHYSICAL EXAM: BP 118/79   Pulse 75   Temp 99.3 F (37.4 C) (Oral)   Resp 16   Wt 125 lb 6.4 oz (56.9 kg)   SpO2 98%   BMI 22.94 kg/m   Physical Exam  General appearance - alert, well appearing, and in no distress Mental status - normal mood, behavior, speech, dress, motor activity, and thought processes Neck - supple, no significant adenopathy Chest - clear to auscultation, no wheezes, rales or rhonchi, symmetric air entry Heart - normal rate, regular rhythm, normal S1, S2, no murmurs, rubs, clicks or gallops Extremities - peripheral pulses normal, no pedal edema, no clubbing or cyanosis  Depression screen [facility] 2/9 10/16/2019 03/17/2019 11/25/2018  Decreased Interest 1 2 1   Down, Depressed, Hopeless 1 2 1   PHQ - 2 Score 2 4 2   Altered sleeping 0 1 0  Tired, decreased energy 2 2 2   Change in appetite 1 2 1   Feeling bad or failure about yourself  2 1 2   Trouble concentrating 0 0 0  Moving slowly or fidgety/restless 1 2 0  Suicidal thoughts 0 0 0  PHQ-9 Score 8 12 7       CMP Latest Ref Rng & Units 07/28/2019 03/17/2019 04/17/2018  Glucose 65 - 99 mg/dL 309(H) 300(H) 178(H)  BUN 6 - 24 mg/dL 11 13 12   Creatinine 0.57 - 1.00 mg/dL 0.64 0.53(L) 0.42(L)  Sodium 134 - 144 mmol/L 136 133(L) 135  Potassium 3.5 - 5.2 mmol/L 4.5 4.0 3.9  Chloride 96 - 106 mmol/L 99 98 99  CO2 20 - 29 mmol/L 26 22 22   Calcium 8.7 - 10.2 mg/dL 9.2 9.2 9.1  Total Protein 6.0 - 8.5 g/dL 6.7 - 7.1  Total Bilirubin 0.0 - 1.2 mg/dL 0.3 - 0.2  Alkaline Phos 39 - 117 IU/L 117 - 97  AST 0 - 40 IU/L 12 - 12  ALT 0 - 32 IU/L 12 - 7   Lipid Panel     Component Value Date/Time   CHOL 290 (H) 07/28/2019 1622   TRIG 562 (HH) 07/28/2019 1622   HDL 38 (L) 07/28/2019 1622   CHOLHDL 7.6 (H)  07/28/2019 1622   LDLCALC Comment 07/28/2019 1622    CBC    Component Value Date/Time   WBC 6.1 07/28/2019 1622   WBC 8.0 04/30/2017 1135   RBC  4.82 07/28/2019 1622   RBC 4.33 04/30/2017 1135   HGB 15.0 07/28/2019 1622   HCT 47.1 (H) 07/28/2019 1622   PLT 319 07/28/2019 1622   MCV 98 (H) 07/28/2019 1622   MCH 31.1 07/28/2019 1622   MCH 30.5 04/30/2017 1135   MCHC 31.8 07/28/2019 1622   MCHC 33.8 04/30/2017 1135   RDW 12.5 07/28/2019 1622   LYMPHSABS 2.4 04/30/2017 1135   MONOABS 0.5 04/30/2017 1135   EOSABS 0.1 04/30/2017 1135   BASOSABS 0.0 04/30/2017 1135   Results for orders placed or performed in visit on 10/16/19  Glucose (CBG)  Result Value Ref Range   POC Glucose 315 (A) 70 - 99 mg/dl  HgB A1c  Result Value Ref Range   Hemoglobin A1C     HbA1c POC (<> result, manual entry)     HbA1c, POC (prediabetic range)     HbA1c, POC (controlled diabetic range) 11.6 (A) 0.0 - 7.0 %    ASSESSMENT AND PLAN: 1. Uncontrolled type 2 diabetes mellitus with hyperglycemia, without long-term current use of insulin ([place]) -Getting her to comply with medication has been an ongoing issue.  I recommend that she restart her oral medications.  She will hold off on restarting Lantus.  Discussed the importance of better diabetes control to prevent future complications.  She is overdue for eye exam but does not have insurance. - Glucose (CBG) - HgB A1c - Microalbumin/Creatinine Ratio, Urine - glimepiride (AMARYL) 4 MG tablet; Take 1 tablet (4 mg total) by mouth daily before breakfast. Or before the first meal of the day  Dispense: 90 tablet; Refill: 3 - metFORMIN (GLUCOPHAGE) 1000 MG tablet; Take 1 tablet (1,000 mg total) by mouth 2 (two) times daily with a meal.  Dispense: 60 tablet; Refill: 6  2. Mixed hyperlipidemia Restart Pravachol - pravastatin (PRAVACHOL) 20 MG tablet; Take 1 tablet (20 mg total) by mouth daily.  Dispense: 90 tablet; Refill: 2  3. Anxiety and depression Patient met  with LCSW today.  She is willing to try a different antidepressant.  Lexapro taken off her med list and she was told not to restart it.  We will put her on Zoloft instead.  She will follow up in 6 weeks to see how she is doing on the medicine - sertraline (ZOLOFT) 50 MG tablet; 1/2 tab Po daily x 2 wks then 1 tab PO daily  Dispense: 30 tablet; Refill: 1     Patient was given the opportunity to ask questions.  Patient verbalized understanding of the plan and was able to repeat key elements of the plan.  [employer] interpreter used during this encounter.  Orders Placed This Encounter  Procedures  . Microalbumin/Creatinine Ratio, Urine  . Glucose (CBG)  . HgB A1c     Requested Prescriptions   Signed Prescriptions Disp Refills  . glimepiride (AMARYL) 4 MG tablet 90 tablet 3    Sig: Take 1 tablet (4 mg total) by mouth daily before breakfast. Or before the first meal of the day  . metFORMIN (GLUCOPHAGE) 1000 MG tablet 60 tablet 6    Sig: Take 1 tablet (1,000 mg total) by mouth 2 (two) times daily with a meal.  . pravastatin (PRAVACHOL) 20 MG tablet 90 tablet 2    Sig: Take 1 tablet (20 mg total) by mouth daily.  . sertraline (ZOLOFT) 50 MG tablet 30 tablet 1    Sig: 1/2 tab Po daily x 2 wks then 1 tab PO daily    Return in  about 6 weeks (around 11/27/2019).  Karle Plumber, MD, FACP

## 2019-10-16 NOTE — Patient Instructions (Signed)
Influenza Virus Vaccine injection (Fluarix) °¿Qué es este medicamento? °La VACUNA ANTIGRIPAL ayuda a disminuir el riesgo de contraer la influenza, también conocida como la gripe. La vacuna solo ayuda a protegerle contra algunas cepas de influenza. Esta vacuna no ayuda a reducir el riesgo de contraer influenza pandémica H1N1. °Este medicamento puede ser utilizado para otros usos; si tiene alguna pregunta consulte con su proveedor de atención médica o con su farmacéutico. °MARCAS COMUNES: Fluarix, Fluzone °¿Qué le debo informar a mi profesional de la salud antes de tomar este medicamento? °Necesita saber si usted presenta alguno de los siguientes problemas o situaciones: °· trastorno de sangrado como hemofilia °· fiebre o infección °· síndrome de Guillain-Barre u otros problemas neurológicos °· problemas del sistema inmunológico °· infección por el virus de la inmunodeficiencia humana (VIH) o SIDA °· niveles bajos de plaquetas en la sangre °· esclerosis múltiple °· una reacción alérgica o inusual a las vacunas antigripales, a los huevos, proteínas de pollo, al látex, a la gentamicina, a otros medicamentos, alimentos, colorantes o conservantes °· si está embarazada o buscando quedar embarazada °· si está amamantando a un bebé °¿Cómo debo utilizar este medicamento? °Esta vacuna se administra mediante inyección por vía intramuscular. Lo administra un profesional de la salud. °Recibirá una copia de información escrita sobre la vacuna antes de cada vacuna. Asegúrese de leer este folleto cada vez cuidadosamente. Este folleto puede cambiar con frecuencia. °Hable con su pediatra para informarse acerca del uso de este medicamento en niños. Puede requerir atención especial. °Sobredosis: Póngase en contacto inmediatamente con un centro toxicológico o una sala de urgencia si usted cree que haya tomado demasiado medicamento. °ATENCIÓN: Este medicamento es solo para usted. No comparta este medicamento con nadie. °¿Qué sucede si me  olvido de una dosis? °No se aplica en este caso. °¿Qué puede interactuar con este medicamento? °· quimioterapia o radioterapia °· medicamentos que suprimen el sistema inmunológico, tales como etanercept, anakinra, infliximab y adalimumab °· medicamentos que tratan o previenen coágulos sanguíneos, como warfarina °· fenitoína °· medicamentos esteroideos, como la prednisona o la cortisona °· teofilina °· vacunas °Puede ser que esta lista no menciona todas las posibles interacciones. Informe a su profesional de la salud de todos los productos a base de hierbas, medicamentos de venta libre o suplementos nutritivos que esté tomando. Si usted fuma, consume bebidas alcohólicas o si utiliza drogas ilegales, indíqueselo también a su profesional de la salud. Algunas sustancias pueden interactuar con su medicamento. °¿A qué debo estar atento al usar este medicamento? °Informe a su médico o a su profesional de la salud sobre todos los efectos secundarios que persistan después de 3 días. Llame a su proveedor de atención médica si se presentan síntomas inusuales dentro de las 6 semanas posteriores a la vacunación. °Es posible que todavía pueda contraer la gripe, pero la enfermedad no será tan fuerte como normalmente. No puede contraer la gripe de esta vacuna. La vacuna antigripal no le protege contra resfríos u otras enfermedades que pueden causar fiebre. Debe vacunarse cada año. °¿Qué efectos secundarios puedo tener al utilizar este medicamento? °Efectos secundarios que debe informar a su médico o a su profesional de la salud tan pronto como sea posible: °· reacciones alérgicas como erupción cutánea, picazón o urticarias, hinchazón de la cara, labios o lengua °Efectos secundarios que, por lo general, no requieren atención médica (debe informarlos a su médico o a su profesional de la salud si persisten o si son molestos): °· fiebre °· dolor de cabeza °· molestias y dolores musculares °·   dolor, sensibilidad, enrojecimiento o  hinchazón en el lugar de la inyección °· cansancio o debilidad °Puede ser que esta lista no menciona todos los posibles efectos secundarios. Comuníquese a su médico por asesoramiento médico sobre los efectos secundarios. Usted puede informar los efectos secundarios a la FDA por teléfono al 1-800-FDA-1088. °¿Dónde debo guardar mi medicina? °Esta vacuna se administra solamente en clínicas, farmacias, consultorio médico u otro consultorio de un profesional de la salud y no necesitará guardarlo en su domicilio. °ATENCIÓN: Este folleto es un resumen. Puede ser que no cubra toda la posible información. Si usted tiene preguntas acerca de esta medicina, consulte con su médico, su farmacéutico o su profesional de la salud. °© 2020 Elsevier/Gold Standard (2010-05-31 15:31:40) ° °

## 2019-10-17 LAB — MICROALBUMIN / CREATININE URINE RATIO
Creatinine, Urine: 43.1 mg/dL
Microalb/Creat Ratio: 51 mg/g creat — ABNORMAL HIGH (ref 0–29)
Microalbumin, Urine: 21.8 ug/mL

## 2019-10-24 ENCOUNTER — Ambulatory Visit: Payer: Self-pay | Attending: Internal Medicine

## 2019-10-24 ENCOUNTER — Other Ambulatory Visit: Payer: Self-pay
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
## 2019-11-03 ENCOUNTER — Ambulatory Visit: Payer: Self-pay

## 2019-11-03 ENCOUNTER — Ambulatory Visit: Payer: Self-pay | Admitting: Internal Medicine

## 2019-11-03 ENCOUNTER — Other Ambulatory Visit: Payer: Self-pay

## 2019-11-10 ENCOUNTER — Ambulatory Visit: Payer: Self-pay | Attending: Internal Medicine

## 2019-11-10 ENCOUNTER — Other Ambulatory Visit: Payer: Self-pay

## 2019-11-27 ENCOUNTER — Other Ambulatory Visit: Payer: Self-pay | Admitting: Cardiology

## 2019-11-27 DIAGNOSIS — Z20822 Contact with and (suspected) exposure to covid-19: Secondary | ICD-10-CM

## 2019-11-29 ENCOUNTER — Telehealth (HOSPITAL_COMMUNITY): Payer: Self-pay | Admitting: Critical Care Medicine

## 2019-11-29 LAB — NOVEL CORONAVIRUS, NAA: SARS-CoV-2, NAA: DETECTED — AB

## 2019-11-29 NOTE — Telephone Encounter (Signed)
I connected with this patient who is Covid positive.  She does have type 2 diabetes however she currently is not having any symptoms at this time.  Therefore she is not a candidate for monoclonal antibody.  She tested positive at the December 17 testing event.  I will forward her results from her primary care provider and told the patient if she worsens with her symptoms and develops symptoms of the virus she should contact me back as she would be a candidate because of her diabetes for monoclonal antibody infusion.  Note this encounter occurred with the help of a Spanish interpreter  The patient knows to stay in isolation until December 28

## 2019-11-29 NOTE — Telephone Encounter (Signed)
-----   Message from Sarah E Ellington, RN sent at 11/29/2019  7:03 AM EST -----  ----- Message ----- From: Interface, [facility] Lab Results In Sent: 11/29/2019   2:35 AM EST To: Mobile Screening Testing Result Pool   

## 2019-12-03 ENCOUNTER — Ambulatory Visit: Payer: Self-pay | Attending: Family Medicine | Admitting: Family Medicine

## 2019-12-03 ENCOUNTER — Encounter: Payer: Self-pay | Admitting: Family Medicine

## 2019-12-03 ENCOUNTER — Other Ambulatory Visit: Payer: Self-pay

## 2019-12-03 DIAGNOSIS — R0789 Other chest pain: Secondary | ICD-10-CM

## 2019-12-03 DIAGNOSIS — Z758 Other problems related to medical facilities and other health care: Secondary | ICD-10-CM

## 2019-12-03 DIAGNOSIS — U071 COVID-19: Secondary | ICD-10-CM

## 2019-12-03 DIAGNOSIS — E1165 Type 2 diabetes mellitus with hyperglycemia: Secondary | ICD-10-CM

## 2019-12-03 DIAGNOSIS — Z603 Acculturation difficulty: Secondary | ICD-10-CM

## 2019-12-03 DIAGNOSIS — J069 Acute upper respiratory infection, unspecified: Secondary | ICD-10-CM

## 2019-12-03 DIAGNOSIS — R079 Chest pain, unspecified: Secondary | ICD-10-CM

## 2019-12-03 DIAGNOSIS — Z789 Other specified health status: Secondary | ICD-10-CM

## 2019-12-03 NOTE — Progress Notes (Signed)
Patient verified DOB Patient denies pain at this time. Patient denies N/V diarrhea. Patient has no fevers at home. Patient complains of NO SMELL/TASTE. Patient has mild HA and dizziness.

## 2019-12-03 NOTE — Progress Notes (Signed)
Virtual Visit via Telephone Note  I connected with Faith Alvarez on 12/03/19 at  2:10 PM EST by telephone and verified that I am speaking with the correct person using two identifiers.   I discussed the limitations, risks, security and privacy concerns of performing an evaluation and management service by telephone and the availability of in person appointments. I also discussed with the patient that there may be a patient responsible charge related to this service. The patient expressed understanding and agreed to proceed.  Patient Location: Home Provider Location: [facility] Others participating in call: call initiated by [place], [place] who    History of Present Illness:        48 yo female who is a patient of Dr. Karle Plumber, MD who has been recently diagnosed with COVID-19 and she is having a loss of sense of taste and smell, generalized dull headache and body aches. No fever or sore throat. No abdominal pain. No SOB or cough. She thinks that she may contracted COVID-19 and her manager had mentioned that there were people at her workplace who were positive. She also has adult daughters in the home who go out but they have not had COVID symptoms. She denies cough or SOB but she has had occasional sharp, pinching pain in her right chest. She has not actually checked her temperature with a thermometer as she has not felt that she has had a fever. She has not taken any medication for her headaches or body-aches. Body aches especially back pain worse when lying down versus standing/sitting up. She does have fatigue.  Past Medical History:  Diagnosis Date  . Anxiety   . Depression   . Diabetes mellitus without complication ([place])   . Diabetic neuropathy ([place])   . GERD (gastroesophageal reflux disease)   . Hyperlipidemia     No past surgical history on file.  Family History  Problem Relation Age of Onset  . Diabetes Mother   . Diabetes Sister     Social History   Tobacco  Use  . Smoking status: Never Smoker  . Smokeless tobacco: Never Used  Substance Use Topics  . Alcohol use: Never  . Drug use: Never     No Known Allergies     Observations/Objective: No vital signs or physical exam conducted as visit was done via telephone Patient was able to talk in full sentences without pausing due to shortness of breath and no cough while on the phone.  Assessment and Plan: 1. COVID-19 virus infection; 3. Upper respiratory tract infection due to COVID-19; 4. Right sided chest pain; 5. Language barrier 2. Uncontrolled type 2 diabetes mellitus with hyperglycemia, without long-term current use of insulin ([place])      Patient with positive test for coronavirus/COVID-19 on 11/27/2019 and reports current symptoms of generalized headache, loss of sensation of taste and smell, generalized body aches worse with lying down and occasional sharp, pinching right-sided chest pain.  She denies shortness of breath or cough.  Patient with history of uncontrolled diabetes with last hemoglobin A1c on 10/16/2019 of 11.6 which places patient at high risk category for COVID-19 infection.  Discussed infusion therapy with the patient but she is somewhat hesitant.  Patient did agree to have referral placed to infusion therapy clinic and phone call was placed to [phone] and information left so the patient can be contacted regarding infusion therapy as she is less than 10 days out from diagnosis of COVID-19 and is in a high risk category  for complications from COVID-19.        In the interim, she is encouraged to rest, remain well-hydrated and take Tylenol for headaches/muscle aches but also start daily baby aspirin for the next 7 to 10 days and she may take nonsteroidal anti-inflammatory for pain not controlled by Tylenol use.  She is also encouraged to obtain an over-the-counter vitamin D C supplement for daily use over the next week and she may use over-the-counter medications as needed for other  symptoms.  If she has worsening of her current symptoms, onset of shortness of breath/difficulty breathing, worsening chest pain she should go to the emergency department for further evaluation and treatment.  She reports compliance with current self quarantine. [place] Interpretation systems used due to language barrier.   Follow Up Instructions:Return for 1-2 weeks with PCP; ED if symptoms worsen.    I discussed the assessment and treatment plan with the patient. The patient was provided an opportunity to ask questions and all were answered. The patient agreed with the plan and demonstrated an understanding of the instructions.   The patient was advised to call back or seek an in-person evaluation if the symptoms worsen or if the condition fails to improve as anticipated.  I provided 20 minutes of non-face-to-face time during this encounter.   Cain Saupe, MD

## 2019-12-08 ENCOUNTER — Other Ambulatory Visit: Payer: Self-pay | Admitting: Cardiology

## 2019-12-08 DIAGNOSIS — Z20822 Contact with and (suspected) exposure to covid-19: Secondary | ICD-10-CM

## 2019-12-10 ENCOUNTER — Telehealth: Payer: Self-pay | Admitting: *Deleted

## 2019-12-10 LAB — NOVEL CORONAVIRUS, NAA: SARS-CoV-2, NAA: NOT DETECTED

## 2019-12-10 NOTE — Telephone Encounter (Signed)
Was on phone with pt giving minor's lab results and she asked her COVID result which was negative. Todrick Siedschlag P Merel Santoli

## 2019-12-22 ENCOUNTER — Other Ambulatory Visit: Payer: Self-pay

## 2019-12-22 ENCOUNTER — Ambulatory Visit: Payer: Self-pay | Attending: Internal Medicine | Admitting: Internal Medicine

## 2020-01-19 ENCOUNTER — Ambulatory Visit: Payer: Self-pay | Attending: Internal Medicine | Admitting: Internal Medicine

## 2020-01-19 ENCOUNTER — Ambulatory Visit: Payer: Self-pay | Attending: Internal Medicine | Admitting: Licensed Clinical Social Worker

## 2020-01-19 ENCOUNTER — Other Ambulatory Visit: Payer: Self-pay

## 2020-01-19 VITALS — BP 146/81 | HR 74 | Temp 97.5°F | Resp 16 | Wt 127.4 lb

## 2020-01-19 DIAGNOSIS — F419 Anxiety disorder, unspecified: Secondary | ICD-10-CM

## 2020-01-19 DIAGNOSIS — F321 Major depressive disorder, single episode, moderate: Secondary | ICD-10-CM

## 2020-01-19 DIAGNOSIS — N898 Other specified noninflammatory disorders of vagina: Secondary | ICD-10-CM

## 2020-01-19 DIAGNOSIS — E1165 Type 2 diabetes mellitus with hyperglycemia: Secondary | ICD-10-CM

## 2020-01-19 DIAGNOSIS — K029 Dental caries, unspecified: Secondary | ICD-10-CM

## 2020-01-19 DIAGNOSIS — F32A Depression, unspecified: Secondary | ICD-10-CM

## 2020-01-19 DIAGNOSIS — K0889 Other specified disorders of teeth and supporting structures: Secondary | ICD-10-CM

## 2020-01-19 DIAGNOSIS — F329 Major depressive disorder, single episode, unspecified: Secondary | ICD-10-CM

## 2020-01-19 LAB — POCT GLYCOSYLATED HEMOGLOBIN (HGB A1C): HbA1c, POC (controlled diabetic range): 11.4 % — AB (ref 0.0–7.0)

## 2020-01-19 LAB — GLUCOSE, POCT (MANUAL RESULT ENTRY): POC Glucose: 342 mg/dL — AB (ref 70–99)

## 2020-01-19 MED ORDER — NYSTATIN-TRIAMCINOLONE 100000-0.1 UNIT/GM-% EX OINT: 1.0000 "application " | TOPICAL_OINTMENT | Freq: Two times a day (BID) | CUTANEOUS | 1 refills | Status: DC

## 2020-01-19 MED ORDER — TRUE METRIX BLOOD GLUCOSE TEST VI STRP: ORAL_STRIP | 12 refills | Status: DC

## 2020-01-19 NOTE — Progress Notes (Signed)
Pt states she doesn't want to take the anxiety medication. Pt states she is wanting to see a therapist instead

## 2020-01-19 NOTE — Progress Notes (Signed)
Patient ID: Faith Alvarez, female    DOB: 01/04/1971  MRN: [id number]  CC: Diabetes and Hypertension   Subjective: Faith Alvarez is a 49 y.o. female who presents for chronic ds management.  Daughter, Faith Alvarez, is with her and interprets per patient's request Her concerns today include:  DM, anx/dep, HL, noncompliance with medications  DIABETES TYPE 2 Last A1C:   Results for orders placed or performed in visit on 01/19/20  POCT glucose (manual entry)  Result Value Ref Range   POC Glucose 342 (A) 70 - 99 mg/dl  POCT glycosylated hemoglobin (Hb A1C)  Result Value Ref Range   Hemoglobin A1C     HbA1c POC (<> result, manual entry)     HbA1c, POC (prediabetic range)     HbA1c, POC (controlled diabetic range) 11.4 (A) 0.0 - 7.0 %    Med Adherence: On last visit she has patient told me that she had stopped taking Metformin and Lantus because she was feeling dizzy and having headaches when she took the medication.  Her A1c on that visit was 11.6.  We stopped the Lantus and continued with Metformin and Amaryl.   -Today she reports that she is still not taking medicines consistently.  She attributes this to increased family stress.  She spends most of the days over at her sister's house trying to comfort and take care of her because her sister's husband has been arrested.  She admits that she is neglecting her own health.     Medication side effects:  _0  Yes    _1  No Home Monitoring?  _2  Yes    _3  No Home glucose results range: 200-300s Want to see foot doctor.  Foot itches at times.  Numbness in toes sometimes  HL: She is not taking pravastatin for the same reason she is not taking her diabetes medicines.     Anx/depression: took Zoloft for a while but felt more anxious so she discontinued taking.  Would like to see therapist.  She feels this would be more beneficial to her than taking medication.  Would like to see dentist.  Endorses swelling and pain upper LT  jaw from a molar that is broken off in the gum  Some vaginal itching.  Itching is mainly on the outside lips.  No vaginal discharge Patient Active Problem List   Diagnosis Date Noted  . Anxiety and depression 10/16/2019  . Current moderate episode of major depressive disorder ([place]) 10/14/2018  . 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 Grief reaction 10/22/2017  . Diabetic mononeuropathy associated with type 2 diabetes mellitus ([place]) 10/22/2017  . Anxiety 10/22/2017  . Uncontrolled type 2 diabetes mellitus ([place]) 09/04/2017  . Mixed hyperlipidemia 07/25/2017  . Malaise and fatigue 04/01/2017     Current Outpatient Medications on File Prior to Visit  Medication Sig Dispense Refill  . Blood Glucose Monitoring Suppl (TRUE METRIX METER) w/Device KIT Use as directed 1 kit 0  . gabapentin (NEURONTIN) 100 MG capsule Take 1 capsule (100 mg total) by mouth at bedtime. 90 capsule 3  . glimepiride (AMARYL) 4 MG tablet Take 1 tablet (4 mg total) by mouth daily before breakfast. Or before the first meal of the day 90 tablet 3  . glucose blood (TRUE METRIX BLOOD GLUCOSE TEST) test strip Use as instructed 100 each 12  . hydrOXYzine (ATARAX/VISTARIL) 25 MG tablet Take 1 tablet (25 mg total) by mouth 3 (three) times daily as needed. 90 tablet 1  .  Insulin Pen Needle (B-D UF III MINI PEN NEEDLES) 31G X 5 MM MISC Use as instructed. Inject into the skin once nightly. 100 each 1  . metFORMIN (GLUCOPHAGE) 1000 MG tablet Take 1 tablet (1,000 mg total) by mouth 2 (two) times daily with a meal. 60 tablet 6  . omega-3 acid ethyl esters (LOVAZA) 1 g capsule Take 2 capsules (2 g total) by mouth 2 (two) times daily. 120 capsule 2  . omeprazole (PRILOSEC) 20 MG capsule Take 1 capsule (20 mg total) by mouth daily. To reduce stomach acid 90 capsule 1  . pravastatin (PRAVACHOL) 20 MG tablet Take 1 tablet (20 mg total) by mouth daily. 90 tablet 2  . sertraline (ZOLOFT) 50 MG tablet 1/2 tab Po daily x 2 wks  then 1 tab PO daily 30 tablet 1  . TRUEplus Lancets 28G MISC Use as directed 100 each 12   No current facility-administered medications on file prior to visit.    No Known Allergies  Social History   Socioeconomic History  . Marital status: Married    Spouse name: Not on file  . Number of children: Not on file  . Years of education: Not on file  . Highest education level: Not on file  Occupational History  . Not on file  Tobacco Use  . Smoking status: Never Smoker  . Smokeless tobacco: Never Used  Substance and Sexual Activity  . Alcohol use: Never  . Drug use: Never  . Sexual activity: Yes  Other Topics Concern  . Not on file  Social History Narrative  . Not on file   Social Determinants of Health   Financial Resource Strain:   . Difficulty of Paying Living Expenses: Not on file  Food Insecurity:   . Worried About Charity fundraiser in the Last Year: Not on file  . Ran Out of Food in the Last Year: Not on file  Transportation Needs:   . Lack of Transportation (Medical): Not on file  . Lack of Transportation (Non-Medical): Not on file  Physical Activity:   . Days of Exercise per Week: Not on file  . Minutes of Exercise per Session: Not on file  Stress:   . Feeling of Stress : Not on file  Social Connections:   . Frequency of Communication with Friends and Family: Not on file  . Frequency of Social Gatherings with Friends and Family: Not on file  . Attends Religious Services: Not on file  . Active Member of Clubs or Organizations: Not on file  . Attends Archivist Meetings: Not on file  . Marital Status: Not on file  Intimate Partner Violence:   . Fear of Current or Ex-Partner: Not on file  . Emotionally Abused: Not on file  . Physically Abused: Not on file  . Sexually Abused: Not on file    Family History  Problem Relation Age of Onset  . Diabetes Mother   . Diabetes Sister     No past surgical history on file.  ROS: Review of Systems  Negative except as stated above  PHYSICAL EXAM: BP (!) 146/81   Pulse 74   Temp (!) 97.5 F (36.4 C)   Resp 16   Wt 127 lb 6.4 oz (57.8 kg)   SpO2 97%   BMI 23.30 kg/m   Physical Exam General appearance - alert, well appearing, and in no distress Mental status - normal mood, behavior, speech, dress, motor activity, and thought processes Mouth: No swelling of  the jaw noted.  No peridental abscess.  Her left upper second molar is decayed and broken off in the gum Chest - clear to auscultation, no wheezes, rales or rhonchi, symmetric air entry Heart - normal rate, regular rhythm, normal S1, S2, no murmurs, rubs, clicks or gallops Extremities - peripheral pulses normal, no pedal edema, no clubbing or cyanosis Skin -dusty rubbery appearance to the labia majora on both sides Diabetic Foot Exam - Simple   Simple Foot Form Visual Inspection No deformities, no ulcerations, no other skin breakdown bilaterally: Yes Sensation Testing Intact to touch and monofilament testing bilaterally: Yes Pulse Check Posterior Tibialis and Dorsalis pulse intact bilaterally: Yes Comments     CMP Latest Ref Rng & Units 07/28/2019 03/17/2019 04/17/2018  Glucose 65 - 99 mg/dL 309(H) 300(H) 178(H)  BUN 6 - 24 mg/dL _0 Creatinine 0.57 - 1.00 mg/dL 0.64 0.53(L) 0.42(L)  Sodium 134 - 144 mmol/L 136 133(L) 135  Potassium 3.5 - 5.2 mmol/L 4.5 4.0 3.9  Chloride 96 - 106 mmol/L 99 98 99  CO2 20 - 29 mmol/L _1 Calcium 8.7 - 10.2 mg/dL 9.2 9.2 9.1  Total Protein 6.0 - 8.5 g/dL 6.7 - 7.1  Total Bilirubin 0.0 - 1.2 mg/dL 0.3 - 0.2  Alkaline Phos 39 - 117 IU/L 117 - 97  AST 0 - 40 IU/L 12 - 12  ALT 0 - 32 IU/L 12 - 7   Lipid Panel     Component Value Date/Time   CHOL 290 (H) 07/28/2019 1622   TRIG 562 (HH) 07/28/2019 1622   HDL 38 (L) 07/28/2019 1622   CHOLHDL 7.6 (H) 07/28/2019 1622   LDLCALC Comment 07/28/2019 1622    CBC    Component Value Date/Time   WBC 6.1 07/28/2019 1622   WBC 8.0  04/30/2017 1135   RBC 4.82 07/28/2019 1622   RBC 4.33 04/30/2017 1135   HGB 15.0 07/28/2019 1622   HCT 47.1 (H) 07/28/2019 1622   PLT 319 07/28/2019 1622   MCV 98 (H) 07/28/2019 1622   MCH 31.1 07/28/2019 1622   MCH 30.5 04/30/2017 1135   MCHC 31.8 07/28/2019 1622   MCHC 33.8 04/30/2017 1135   RDW 12.5 07/28/2019 1622   LYMPHSABS 2.4 04/30/2017 1135   MONOABS 0.5 04/30/2017 1135   EOSABS 0.1 04/30/2017 1135   BASOSABS 0.0 04/30/2017 1135    ASSESSMENT AND PLAN: 1. Uncontrolled type 2 diabetes mellitus with hyperglycemia, without long-term current use of insulin ([place]) Stressed importance of good diabetes control to prevent complications.  She is already experiencing some numbness in the toes.  Also informed her that the vaginal itching caused by yeast is also associated with persistent uncontrolled diabetes. -Patient states she will make a concerted effort to take her medications every day.  Advised that she take her medicines with her in her purse since she is over at her sister's house most of the time - POCT glucose (manual entry) - POCT glycosylated hemoglobin (Hb A1C) - Microalbumin / creatinine urine ratio - glucose blood (TRUE METRIX BLOOD GLUCOSE TEST) test strip; Use as instructed  Dispense: 100 each; Refill: 12  2. Pain, dental - Ambulatory referral to Dentistry  3. Dental cavity - Ambulatory referral to Dentistry  4. Vagina itching - nystatin-triamcinolone ointment (MYCOLOG); Apply 1 application topically 2 (two) times daily. Apply to the outside of the vaginal area twice a day as needed.  Do not insert cream into the vagina.  Dispense: 30 g; Refill:  1  5. Anxiety and depression LCSW to see patient today.  She denies any suicidal ideation.  Zoloft taken off med list that she is not taking it   Patient was given the opportunity to ask questions.  Patient verbalized understanding of the plan and was able to repeat key elements of the plan.   Orders Placed This  Encounter  Procedures  . Microalbumin / creatinine urine ratio  . POCT glucose (manual entry)  . POCT glycosylated hemoglobin (Hb A1C)     Requested Prescriptions    No prescriptions requested or ordered in this encounter    No follow-ups on file.  Karle Plumber, MD, FACP

## 2020-01-20 LAB — MICROALBUMIN / CREATININE URINE RATIO
Creatinine, Urine: 22.8 mg/dL
Microalb/Creat Ratio: <13 mg/g creat (ref 0–29)
Microalbumin, Urine: <3 ug/mL

## 2020-01-21 ENCOUNTER — Other Ambulatory Visit: Payer: Self-pay | Admitting: Internal Medicine

## 2020-01-21 MED ORDER — FLUCONAZOLE 150 MG PO TABS: 150.0000 mg | ORAL_TABLET | ORAL | 0 refills | Status: DC

## 2020-01-27 ENCOUNTER — Telehealth: Payer: Self-pay

## 2020-01-27 NOTE — Telephone Encounter (Signed)
[employer] angelica Id# [phone]  contacted pt to go over lab results pt didn't answer lvm asking pt to give a call back at her earliest convenience

## 2020-01-27 NOTE — BH Specialist Note (Signed)
Integrated Behavioral Health Initial Visit  MRN: [id number] Name: Faith Alvarez  Number of Integrated Behavioral Health Clinician visits:: 1/6 Session Start time: 4:15 PM  Session End time: 4:30 PM Total time: 15  Type of Service: Integrated Behavioral Health- Individual Interpretor:Yes.   Interpretor Name and Language: Adult daughter, spanish   Warm Hand Off Completed.       SUBJECTIVE: Faith Alvarez is a 49 y.o. female accompanied by Daughter Patient was referred by Dr. Laural Benes for depression and anxiety. Patient reports the following symptoms/concerns: Pt reports increase in symptoms triggered by psychosocial stressors. She is constantly worried about her sister due to brother in law's current incarceration, limited finances, and adult daughter's impending move to [place] to attend college Duration of problem: Ongoing; Severity of problem: severe  OBJECTIVE: Mood: Anxious and Depressed and Affect: Tearful Risk of harm to self or others: No plan to harm self or others  LIFE CONTEXT: Family and Social: Pt receives support from family School/Work: Pt is employed Self-Care: Pt reports difficulty to remember to take medications due to stress and busy schedule Life Changes: Pt has difficulty managing medical conditions due to ongoing stress  GOALS ADDRESSED: Patient will: 1. Reduce symptoms of: anxiety, depression and stress 2. Increase knowledge and/or ability of: stress reduction  3. Demonstrate ability to: Increase healthy adjustment to current life circumstances, Increase adequate support systems for patient/family, Increase motivation to adhere to plan of care and Improve medication compliance  INTERVENTIONS: Interventions utilized: Solution-Focused Strategies, Supportive Counseling and Link to [facility]  Standardized Assessments completed: GAD-7 and PHQ 2&9  ASSESSMENT: Patient currently experiencing difficulty managing medical  conditions and mental health triggered by psychosocial stressors. She denies SI/HI   Patient may benefit from therapy and adherence to medications. LCSW validated patient's feelings and provided encouragement. The correlation between one's physical and mental health was discussed, in addition, to how stress negatively impacts functioning. Self-care strategies were discussed in hopes to assist in the decrease or management of symptoms. Pt was strongly encouraged to initiate therapy at [facility] the [place].   PLAN: 1. Follow up with behavioral health clinician on : Contact LCSW with any behavioral health or resource needs 2. Behavioral recommendations: Pt was strongly encouraged to adhere to BP medications and initiate therapy at [facility] to assist in management of mental health.  3. Referral(s): Community Mental Health Services (LME/Outside Clinic) 4. "From scale of 1-10, how likely are you to follow plan?":   Bridgett Larsson, LCSW 01/27/2020 2:20 PM

## 2020-02-27 ENCOUNTER — Telehealth: Payer: Self-pay | Admitting: Internal Medicine

## 2020-02-27 MED ORDER — AMOXICILLIN 500 MG PO CAPS: 500.0000 mg | ORAL_CAPSULE | Freq: Three times a day (TID) | ORAL | 0 refills | Status: DC

## 2020-02-27 NOTE — Telephone Encounter (Signed)
[place] daughter called requesting PCP to send antibiotics for her pain as she was denied a dental appointment. Please call her at [phone].

## 2020-02-27 NOTE — Telephone Encounter (Signed)
Please call pt and make aware

## 2020-02-27 NOTE — Telephone Encounter (Signed)
Please contact pt.

## 2020-02-27 NOTE — Telephone Encounter (Signed)
Patient called and informed. She would prefer the rx to be sent to [facility] on [address]. Rx resent.

## 2020-03-22 ENCOUNTER — Other Ambulatory Visit: Payer: Self-pay

## 2020-03-22 ENCOUNTER — Ambulatory Visit: Payer: Self-pay | Admitting: Internal Medicine

## 2020-03-25 ENCOUNTER — Telehealth: Payer: Self-pay | Admitting: Pediatric Intensive Care

## 2020-03-25 ENCOUNTER — Telehealth: Payer: Self-pay

## 2020-03-25 NOTE — Telephone Encounter (Signed)
Call from client- interpretation with Irving Burton- [employer] case worker. CN advised client that she would like to speak with client regarding diabetes management concerns. Client was referred by Renea Ee at [facility] for [facility]. Client states that she works all days except Tuesdays. CN will call client Tuesday 4/20 at 0900 for follow up. CN advised client that she has an appointment at [facility] on Monday afternoon. Client states that she just had a virtual appointment and that she will not be able to go in person due to work.

## 2020-03-25 NOTE — Telephone Encounter (Signed)
Rescheduled patient's appointment at [facility] for 04/06/2020 @ 1330.

## 2020-03-25 NOTE — Telephone Encounter (Signed)
Left HIPAA compliant message with [employer] [phone] to return call to this CN. Shann Medal RN BSN CNP [phone]

## 2020-03-29 ENCOUNTER — Ambulatory Visit: Payer: Self-pay | Admitting: Family

## 2020-03-30 ENCOUNTER — Telehealth: Payer: Self-pay | Admitting: Pediatric Intensive Care

## 2020-03-30 NOTE — Telephone Encounter (Signed)
Call to [place] at [facility] for [facility]. She referred client to this CN. CN would like to coordinate home visit or call with CHW as she has a relationship with this client. CN advised Renea Ee about call to client and some barriers regarding medication and diabetes management. CHW will call client to clarify need for transportation for appointment, follow up on [facility] visit to [facility] and making sure she has her diabetes medication. Shann Medal RN BSN CNP [phone]

## 2020-03-30 NOTE — Telephone Encounter (Signed)
Call to client with [facility] Interpretation # [phone]. Clien tID verified x2. CN advised client that her appointment has been re-scheduled for 4/27 at 3.30pm. Client understands. CN asks what assistance she needs with diabetes management. Client says that she does not have any medicine. She says she asked the clinic if the medicine could be transferred to [facility] at [place] so her daughter could pick up. CN states that CN will check to see where the medication is prescribed. Client states that her glucometer is broken. Cn asks if client was able to see a counselor at [facility]. This took several clarifications with interpretation as client stated that she was seeing the counselor at [facility]. When CN asked name of counselor, client said she had not gone. CN advised client that CN could assist with making appointment for client at [facility]. Interpretation for call was dropped. Shann Medal RN BSN CNP

## 2020-03-30 NOTE — Telephone Encounter (Signed)
Call to client with [facility] Interpretation [phone]. Left message that [facility] appointment has been re-scheduled. Please return call or text with confirmation. Shann Medal RN BSN CNP [phone]

## 2020-04-06 ENCOUNTER — Encounter: Payer: Self-pay | Admitting: Family

## 2020-04-06 ENCOUNTER — Ambulatory Visit: Payer: Self-pay | Attending: Family | Admitting: Family

## 2020-04-06 ENCOUNTER — Other Ambulatory Visit: Payer: Self-pay

## 2020-04-06 VITALS — BP 128/75 | HR 83 | Temp 97.9°F | Resp 16 | Wt 126.2 lb

## 2020-04-06 DIAGNOSIS — F329 Major depressive disorder, single episode, unspecified: Secondary | ICD-10-CM

## 2020-04-06 DIAGNOSIS — F419 Anxiety disorder, unspecified: Secondary | ICD-10-CM

## 2020-04-06 DIAGNOSIS — E1165 Type 2 diabetes mellitus with hyperglycemia: Secondary | ICD-10-CM

## 2020-04-06 DIAGNOSIS — E782 Mixed hyperlipidemia: Secondary | ICD-10-CM

## 2020-04-06 DIAGNOSIS — F32A Depression, unspecified: Secondary | ICD-10-CM

## 2020-04-06 DIAGNOSIS — Z603 Acculturation difficulty: Secondary | ICD-10-CM

## 2020-04-06 DIAGNOSIS — Z789 Other specified health status: Secondary | ICD-10-CM

## 2020-04-06 LAB — POCT GLYCOSYLATED HEMOGLOBIN (HGB A1C): HbA1c, POC (controlled diabetic range): 10.9 % — AB (ref 0.0–7.0)

## 2020-04-06 LAB — GLUCOSE, POCT (MANUAL RESULT ENTRY): POC Glucose: 293 mg/dl — AB (ref 70–99)

## 2020-04-06 MED ORDER — TRUE METRIX BLOOD GLUCOSE TEST VI STRP: ORAL_STRIP | 12 refills | Status: DC

## 2020-04-06 MED ORDER — GLIMEPIRIDE 4 MG PO TABS: 4.0000 mg | ORAL_TABLET | Freq: Every day | ORAL | 0 refills | Status: DC

## 2020-04-06 MED ORDER — PRAVASTATIN SODIUM 20 MG PO TABS: 20.0000 mg | ORAL_TABLET | Freq: Every day | ORAL | 0 refills | Status: DC

## 2020-04-06 MED ORDER — TRUEPLUS LANCETS 28G MISC: 12 refills | Status: DC

## 2020-04-06 MED ORDER — METFORMIN HCL 500 MG PO TABS: 500.0000 mg | ORAL_TABLET | Freq: Two times a day (BID) | ORAL | 2 refills | Status: DC

## 2020-04-06 MED ORDER — TRUE METRIX METER W/DEVICE KIT: PACK | 0 refills | Status: DC

## 2020-04-06 NOTE — Patient Instructions (Addendum)
Continue Amaryl and Atorvastatin. Decreasing Metformin dose. Follow-up with primary physician in 2 months or sooner if needed. Informacin bsica sobre la diabetes Diabetes Basics  La diabetes (diabetes mellitus) es una enfermedad de larga duracin (crnica). Se produce cuando el cuerpo no utiliza Occupational hygienist (glucosa) que se libera de los alimentos despus de comer. La diabetes puede deberse a uno de [employer] o a ambos:  El pncreas no produce suficiente cantidad de una hormona llamada insulina.  El cuerpo no reacciona de forma normal a la insulina que produce. La insulina permite que ciertos azcares (glucosa) ingresen a las clulas del cuerpo. Esto le proporciona energa. Si tiene diabetes, los azcares no pueden ingresar a las clulas. Esto produce un aumento del nivel de Dispensing optician (hiperglucemia). Sigue estas instrucciones en tu casa: Cmo se trata la diabetes? Es posible que tenga que administrarse insulina u otros medicamentos para la diabetes todos los [place] para mantener el nivel de Location manager en la sangre equilibrado. Adminstrese los medicamentos para la diabetes todos los [place] se lo haya indicado el mdico. Haga una lista de los medicamentos para la diabetes aqu: Medicamentos para la diabetes  Nombre del medicamento: ______________________________ ? Cantidad (dosis): ________________ Mellody Drown (a.m./p.m.): _______________ Wilford Grist: ___________________________________  Micki Riley medicamento: ______________________________ ? Cantidad (dosis): ________________ Mellody Drown (a.m./p.m.): _______________ Wilford Grist: ___________________________________  Micki Riley medicamento: ______________________________ ? Cantidad (dosis): ________________ Mellody Drown (a.m./p.m.): _______________ Wilford Grist: ___________________________________ Si [place] insulina, aprender cmo aplicrsela con inyecciones. Es posible que deba ajustar la cantidad en funcin de los alimentos que coma. Haga una lista  de los tipos de [place] aqu: Insulina  Tipo de insulina: ______________________________ ? Cantidad (dosis): ________________ Mellody Drown (a.m./p.m.): _______________ Wilford Grist: ___________________________________  Suezanne Jacquet: ______________________________ ? Cantidad (dosis): ________________ Mellody Drown (a.m./p.m.): _______________ Wilford Grist: ___________________________________  Suezanne Jacquet: ______________________________ ? Cantidad (dosis): ________________ Mellody Drown (a.m./p.m.): _______________ Wilford Grist: ___________________________________  Suezanne Jacquet: ______________________________ ? Cantidad (dosis): ________________ Mellody Drown (a.m./p.m.): _______________ Wilford Grist: ___________________________________  Suezanne Jacquet: ______________________________ ? Cantidad (dosis): ________________ Mellody Drown (a.m./p.m.): _______________ Wilford Grist: ___________________________________ Cmo me controlo el nivel de azcar en la sangre?  Controle sus niveles de azcar en la sangre con un medidor de glucemia segn las indicaciones del mdico. El mdico fijar los objetivos del tratamiento para usted. Generalmente, los resultados de los niveles de azcar en la sangre deben ser los siguientes:  Antes de las comidas (preprandial): de 80 a 130mg /dl (de 4,4 a 7,45mmol/l).  Despus de las comidas (posprandial): por debajo de 180mg /dl (69mmol/l).  Nivel de A1c: menos del 7%. Anote las veces que se controlar los niveles de azcar en la sangre: Controles de azcar en la sangre  Hora: _______________ Wilford Grist: ___________________________________  Mellody Drown: _______________ Notas: ___________________________________  Hora: _______________ Notas: ___________________________________  Hora: _______________ Notas: ___________________________________  Hora: _______________ Notas: ___________________________________  Hora: _______________ Notas: ___________________________________  Sander Nephew debo saber acerca del nivel  bajo de azcar en la sangre? Un nivel bajo de azcar en la sangre se denomina hipoglucemia. Este cuadro ocurre cuando el nivel de azcar en la sangre es igual o menor que 70mg /dl (3,35mmol/l). Entre los sntomas, se pueden incluir los siguientes:  Sentir: ? [place]. ? Preocupacin o nervios (ansiedad). ? Sudoracin y [employer]. ? Confusin. ? Mareos. ? Somnolencia. ? Ganas de vomitar (nuseas).  Tener: ? Latidos cardacos acelerados. ? Dolor de [place]. ? Cambios en la visin. ? Hormigueo y falta de sensibilidad (entumecimiento) alrededor de la boca, los labios o la [place]. ? Movimientos espasmdicos que no puede controlar (convulsiones).  Dificultades para  hacer lo siguiente: ? Moverse (coordinacin). ? Dormir. ? Desmayos. ? Molestarse con facilidad (irritabilidad). Tratamiento del nivel bajo de azcar en la sangre Para tratar un nivel bajo de azcar en la sangre, ingiera un alimento o una bebida azucarada de inmediato. Si puede pensar con claridad y tragar de manera segura, siga la regla 15/15, que consiste en lo siguiente:  Consuma 15gramos de un hidrato de carbono de accin rpida (carbohidrato). Hable con su mdico acerca de cunto debera consumir.  Algunos hidratos de carbono de accin rpida son: ? Comprimidos de azcar (pastillas de glucosa). Consuma 3o 4pastillas de glucosa. ? De 6 a 8unidades de caramelos duros. ? De 4 a 6onzas (de 120 a ) de jugo de frutas. ? De 4 a 6onzas (de 120 a ) de refresco comn (no diettico). ? 1 cucharada (4ml) de miel o azcar.  Contrlese el nivel de azcar en la sangre despus de ingerir el hidrato de carbono.  Si el nivel de azcar en la sangre todava es igual o menor que 70mg /dl ( ), ingiera nuevamente 15gramos de un hidrato de carbono.  Si el nivel de azcar en la sangre no supera los 70mg /dl (8,1RRNH/A) despus de 3intentos, solicite ayuda de inmediato.  Ingiera una comida o una  colacin en el transcurso de 1hora despus de que el nivel de azcar en la sangre se haya normalizado. Tratamiento del nivel muy bajo de azcar en la sangre Si el nivel de azcar en la sangre es igual o menor que 54mg /dl (4mmol/l), significa que est muy bajo (hipoglucemia grave). Esto es 5,7XUXY/B. No espere a ver si los sntomas desaparecen. Solicite atencin mdica de inmediato. Comunquese con el servicio de emergencias de su localidad (911 en los [place]). No conduzca por sus propios medios hospital. Preguntas para hacerle al mdico  Es necesario que me rena con 1m en el cuidado de la diabetes?  Qu equipos necesitar para cuidarme en casa?  Qu medicamentos para la diabetes necesito? Cundo debo tomarlos?  Con qu frecuencia debo controlar mi nivel de azcar en la sangre?  A qu nmero puedo llamar si tengo preguntas?  Cundo es mi prxima cita con el mdico?  Dnde puedo encontrar un grupo de apoyo para las personas con diabetes? Dnde buscar ms informacin  American Diabetes Association (Asociacin Estadounidense de la Diabetes): www.diabetes.org  American Association of Diabetes Educators (Asociacin Estadounidense de Instructores para el Cuidado de la Diabetes): www.diabeteseducator.org/patient-resources Comunquese con un mdico si:  El nivel de azcar en la sangre es igual o mayor que 240mg /dl (Radio broadcast assistant) durante 2das seguidos.  Ha estado enfermo o ha tenido fiebre durante 2das o ms y no mejora.  Tiene alguno de estos problemas durante ms de 6horas: ? No puede comer ni beber. ? Siente malestar estomacal (nuseas). ? Vomita. ? Presenta heces lquidas (diarrea). Solicite ayuda inmediatamente si:  El nivel de azcar en la sangre est por debajo de 54mg /dl (27mmol/l).  Se siente confundida.  Tiene dificultad para hacer lo siguiente: ? Pensar con claridad. ? La respiracin. Resumen  La diabetes (diabetes  mellitus) es una enfermedad de larga duracin (crnica). Se produce cuando el cuerpo no utiliza IT trainer (glucosa) que se libera de los alimentos despus de la digestin.  Aplquese la insulina y tome los medicamentos para la diabetes como se lo hayan indicado.  Contrlese el nivel de azcar en la sangre todos los [place], con la frecuencia que le hayan indicado.  Concurra a todas las visitas de  seguimiento como se lo haya indicado el mdico. Esto es importante. Esta informacin no tiene Theme park manager el consejo del mdico. Asegrese de hacerle al mdico cualquier pregunta que tenga. Document Revised: 01/22/2019 Document Reviewed: 04/05/2018 Elsevier Patient Education  2020 [employer].
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
## 2020-04-07 LAB — CMP14+EGFR
ALT: 17 IU/L (ref 0–32)
AST: 12 IU/L (ref 0–40)
Albumin/Globulin Ratio: 1.5 (ref 1.2–2.2)
Albumin: 4 g/dL (ref 3.8–4.8)
Alkaline Phosphatase: 125 IU/L — ABNORMAL HIGH (ref 39–117)
BUN/Creatinine Ratio: 34 — ABNORMAL HIGH (ref 9–23)
BUN: 17 mg/dL (ref 6–24)
Bilirubin Total: <0.2 mg/dL (ref 0.0–1.2)
CO2: 25 mmol/L (ref 20–29)
Calcium: 8.8 mg/dL (ref 8.7–10.2)
Chloride: 100 mmol/L (ref 96–106)
Creatinine, Ser: 0.5 mg/dL — ABNORMAL LOW (ref 0.57–1.00)
GFR calc Af Amer: 133 mL/min/{1.73_m2} (ref >59)
GFR calc non Af Amer: 116 mL/min/{1.73_m2} (ref >59)
Globulin, Total: 2.7 g/dL (ref 1.5–4.5)
Glucose: 248 mg/dL — ABNORMAL HIGH (ref 65–99)
Potassium: 4.4 mmol/L (ref 3.5–5.2)
Sodium: 136 mmol/L (ref 134–144)
Total Protein: 6.7 g/dL (ref 6.0–8.5)

## 2020-04-07 LAB — CBC WITH DIFFERENTIAL
Basophils Absolute: 0 10*3/uL (ref 0.0–0.2)
Basos: 1 %
EOS (ABSOLUTE): 0.1 10*3/uL (ref 0.0–0.4)
Eos: 1 %
Hematocrit: 42.6 % (ref 34.0–46.6)
Hemoglobin: 14 g/dL (ref 11.1–15.9)
Immature Grans (Abs): 0 10*3/uL (ref 0.0–0.1)
Immature Granulocytes: 0 %
Lymphocytes Absolute: 2.7 10*3/uL (ref 0.7–3.1)
Lymphs: 35 %
MCH: 31.3 pg (ref 26.6–33.0)
MCHC: 32.9 g/dL (ref 31.5–35.7)
MCV: 95 fL (ref 79–97)
Monocytes Absolute: 0.4 10*3/uL (ref 0.1–0.9)
Monocytes: 6 %
Neutrophils Absolute: 4.4 10*3/uL (ref 1.4–7.0)
Neutrophils: 57 %
RBC: 4.47 x10E6/uL (ref 3.77–5.28)
RDW: 12.3 % (ref 11.7–15.4)
WBC: 7.6 10*3/uL (ref 3.4–10.8)

## 2020-04-08 NOTE — Progress Notes (Addendum)
CBG and A1C discussed with patient in clinic.  CBC normal meaning no anemia.   Kidney function (BUN/creatinine ratio) higher than expected which may be caused by dehydration. Patient encouraged to increase fluids which may help with this. Will monitor at next appointment with primary physician.   GFR normal meaning no kidney failure.

## 2020-06-08 ENCOUNTER — Other Ambulatory Visit: Payer: Self-pay

## 2020-06-08 ENCOUNTER — Ambulatory Visit: Payer: Self-pay | Attending: Internal Medicine | Admitting: Internal Medicine

## 2020-06-28 ENCOUNTER — Encounter: Payer: Self-pay | Admitting: Family

## 2020-06-28 ENCOUNTER — Other Ambulatory Visit: Payer: Self-pay

## 2020-06-28 ENCOUNTER — Ambulatory Visit: Payer: Self-pay | Attending: Family | Admitting: Family

## 2020-06-28 DIAGNOSIS — E782 Mixed hyperlipidemia: Secondary | ICD-10-CM | POA: Insufficient documentation

## 2020-06-28 DIAGNOSIS — R079 Chest pain, unspecified: Secondary | ICD-10-CM | POA: Insufficient documentation

## 2020-06-28 DIAGNOSIS — Z789 Other specified health status: Secondary | ICD-10-CM

## 2020-06-28 DIAGNOSIS — F329 Major depressive disorder, single episode, unspecified: Secondary | ICD-10-CM | POA: Insufficient documentation

## 2020-06-28 DIAGNOSIS — F419 Anxiety disorder, unspecified: Secondary | ICD-10-CM | POA: Insufficient documentation

## 2020-06-28 DIAGNOSIS — E1165 Type 2 diabetes mellitus with hyperglycemia: Secondary | ICD-10-CM | POA: Insufficient documentation

## 2020-06-28 DIAGNOSIS — Z79899 Other long term (current) drug therapy: Secondary | ICD-10-CM | POA: Insufficient documentation

## 2020-06-28 DIAGNOSIS — E1141 Type 2 diabetes mellitus with diabetic mononeuropathy: Secondary | ICD-10-CM | POA: Insufficient documentation

## 2020-06-28 DIAGNOSIS — Z794 Long term (current) use of insulin: Secondary | ICD-10-CM | POA: Insufficient documentation

## 2020-06-28 LAB — POCT GLYCOSYLATED HEMOGLOBIN (HGB A1C): Hemoglobin A1C: 11.2 % — AB (ref 4.0–5.6)

## 2020-06-28 LAB — GLUCOSE, POCT (MANUAL RESULT ENTRY): POC Glucose: 201 mg/dl — AB (ref 70–99)

## 2020-06-28 MED ORDER — FLUOXETINE HCL 10 MG PO TABS: 10.0000 mg | ORAL_TABLET | Freq: Every day | ORAL | 1 refills | Status: DC

## 2020-06-28 MED ORDER — PRAVASTATIN SODIUM 20 MG PO TABS: 20.0000 mg | ORAL_TABLET | Freq: Every day | ORAL | 0 refills | Status: DC

## 2020-06-28 MED ORDER — TRUEPLUS LANCETS 28G MISC: 12 refills | Status: DC

## 2020-06-28 MED ORDER — TRUE METRIX METER W/DEVICE KIT: PACK | 0 refills | Status: DC

## 2020-06-28 MED ORDER — METFORMIN HCL 1000 MG PO TABS: 1000.0000 mg | ORAL_TABLET | Freq: Two times a day (BID) | ORAL | 2 refills | Status: DC

## 2020-06-28 MED ORDER — GLIMEPIRIDE 4 MG PO TABS: 4.0000 mg | ORAL_TABLET | Freq: Two times a day (BID) | ORAL | 2 refills | Status: DC

## 2020-06-28 MED ORDER — TRUE METRIX BLOOD GLUCOSE TEST VI STRP: ORAL_STRIP | 12 refills | Status: DC

## 2020-06-28 MED ORDER — GABAPENTIN 100 MG PO CAPS: 100.0000 mg | ORAL_CAPSULE | Freq: Every day | ORAL | 0 refills | Status: DC

## 2020-06-28 NOTE — Progress Notes (Signed)
Chest pains   X 1 wk on and off / Pt states pains come and goes with stress or when crying.

## 2020-06-28 NOTE — Progress Notes (Addendum)
Patient ID: Faith Alvarez, female    DOB: 03-29-1971  MRN: [id number]  CC: Diabetes Follow-Up  Subjective: Faith Alvarez is a 49 y.o. female with history of ucontrolled type 2 diabetes mellitus, mixed hyperlipidemia, anxiety, and depression who presents for diabetes follow-up.   1. DIABETES TYPE 2 FOLLOW-UP: Last A1C:  11.2% on 06/28/2020 Are you fasting today: [x]  Yes []  No  Have you taken your anti-diabetic medications today: []  Yes [x]  No  Med Adherence:  []  Yes    [x]  No sometimes doesn't take medications because she forgets  Medication side effects:  []  Yes    [x]  No Home Monitoring?  [x]  Yes    []  No Home glucose results range: 305 Diet Adherence: []  Yes    [x]  No, reports she drinks coffee all day adding regular-sugar and regular-cream Exercise: []  Yes    [x]  No Hypoglycemic episodes?: []  Yes    [x]  No Numbness of the feet? [x]  Yes, only the toes and happens rarely  Retinopathy hx? []  Yes    [x]  No Last eye exam: April 2021, wears corrective lenses   Comments: Last visit 04/06/2020. During that encounter diabetes uncontrolled. CMP and CBC obtained. Amaryl continued. Metformin decreased from 1000 mg twice daily to 500 mg twice daily.   2. HYPERLIPIDEMIA FOLLOW-UP: Last Lipid Panel results:  HDL  Date Value Ref Range Status  07/28/2019 38 (L) >39 mg/dL Final   Triglycerides  Date Value Ref Range Status  07/28/2019 562 (HH) 0 - 149 mg/dL Final    Are you fasting today: [x]  Yes []  No Med Adherence: []  Yes    [x]  No Medication side effects: []  Yes    [x]  No Muscle aches:  []  Yes    [x]  No Diet Adherence: [x]  Yes    []  No Comments: Last visit 04/06/2020. During that encounter Pravastatin continued.   3. ANXIETY FOLLOW-UP: Last visit 04/06/2020. During that encounter referred to Social Work for counseling services.   Today reports she feels like she wants to cry and feels depressed especially while at work. Reports her father passed away 2 years ago  and they were really close and since then she has been grieving. Reports she was on Hydroxyzine in the past and that the medication made her feel more anxious and worse so she quit taking it. Reports she is still interested in Social Work referral and did not receive call last time she was refered.   4. CHEST PAIN: Time since onset: Duration:weeks Onset: sudden Quality: sharp Severity: 4/10 Location: upper right Radiation: upper left sometimes Episode duration:  Frequency: intermittent Related to exertion: yes especially lifting heavy things while at work  Activity when pain started:  Trauma: no Anxiety/recent stressors: yes Aggravating factors:  Alleviating factors:  Status: fluctuating Treatments attempted: nothing  Current pain status: pain free Shortness of breath: no Cough: no Nausea: no Diaphoresis: no Heartburn: no Palpitations: no  Patient Active Problem List   Diagnosis Date Noted  . Anxiety and depression 10/16/2019  . Current moderate episode of major depressive disorder ([place]) 10/14/2018  . 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 Grief reaction 10/22/2017  . Diabetic mononeuropathy associated with type 2 diabetes mellitus ([place]) 10/22/2017  . Anxiety 10/22/2017  . Uncontrolled type 2 diabetes mellitus ([place]) 09/04/2017  . Mixed hyperlipidemia 07/25/2017  . Malaise and fatigue 04/01/2017     Current Outpatient Medications on File Prior to Visit  Medication Sig Dispense Refill  . amoxicillin (AMOXIL) 500 MG  capsule Take 1 capsule (500 mg total) by mouth 3 (three) times daily. (Patient not taking: Reported on 04/06/2020) 15 capsule 0  . Blood Glucose Monitoring Suppl (TRUE METRIX METER) w/Device KIT Use as directed 1 kit 0  . fluconazole (DIFLUCAN) 150 MG tablet Take 1 tablet (150 mg total) by mouth once a week. (Patient not taking: Reported on 04/06/2020) 2 tablet 0  . gabapentin (NEURONTIN) 100 MG capsule Take 1 capsule (100 mg total) by mouth at  bedtime. 90 capsule 3  . glimepiride (AMARYL) 4 MG tablet Take 1 tablet (4 mg total) by mouth daily before breakfast. Or before the first meal of the day 90 tablet 0  . glucose blood (TRUE METRIX BLOOD GLUCOSE TEST) test strip Use as instructed 100 each 12  . hydrOXYzine (ATARAX/VISTARIL) 25 MG tablet Take 1 tablet (25 mg total) by mouth 3 (three) times daily as needed. (Patient not taking: Reported on 04/06/2020) 90 tablet 1  . Insulin Pen Needle (B-D UF III MINI PEN NEEDLES) 31G X 5 MM MISC Use as instructed. Inject into the skin once nightly. (Patient not taking: Reported on 04/06/2020) 100 each 1  . metFORMIN (GLUCOPHAGE) 500 MG tablet Take 1 tablet (500 mg total) by mouth 2 (two) times daily with a meal. 60 tablet 2  . nystatin-triamcinolone ointment (MYCOLOG) Apply 1 application topically 2 (two) times daily. Apply to the outside of the vaginal area twice a day as needed.  Do not insert cream into the vagina. (Patient not taking: Reported on 04/06/2020) 30 g 1  . omega-3 acid ethyl esters (LOVAZA) 1 g capsule Take 2 capsules (2 g total) by mouth 2 (two) times daily. 120 capsule 2  . omeprazole (PRILOSEC) 20 MG capsule Take 1 capsule (20 mg total) by mouth daily. To reduce stomach acid (Patient not taking: Reported on 04/06/2020) 90 capsule 1  . pravastatin (PRAVACHOL) 20 MG tablet Take 1 tablet (20 mg total) by mouth daily. 90 tablet 0  . TRUEplus Lancets 28G MISC Use as directed 100 each 12   No current facility-administered medications on file prior to visit.    No Known Allergies  Social History   Socioeconomic History  . Marital status: Married    Spouse name: Not on file  . Number of children: Not on file  . Years of education: Not on file  . Highest education level: Not on file  Occupational History  . Not on file  Tobacco Use  . Smoking status: Never Smoker  . Smokeless tobacco: Never Used  Substance and Sexual Activity  . Alcohol use: Never  . Drug use: Never  . Sexual  activity: Yes  Other Topics Concern  . Not on file  Social History Narrative  . Not on file   Social Determinants of Health   Financial Resource Strain:   . Difficulty of Paying Living Expenses:   Food Insecurity:   . Worried About Charity fundraiser in the Last Year:   . Arboriculturist in the Last Year:   Transportation Needs:   . Film/video editor (Medical):   Marland Kitchen Lack of Transportation (Non-Medical):   Physical Activity:   . Days of Exercise per Week:   . Minutes of Exercise per Session:   Stress:   . Feeling of Stress :   Social Connections:   . Frequency of Communication with Friends and Family:   . Frequency of Social Gatherings with Friends and Family:   . Attends Religious Services:   .  Active Member of Clubs or Organizations:   . Attends Archivist Meetings:   Marland Kitchen Marital Status:   Intimate Partner Violence:   . Fear of Current or Ex-Partner:   . Emotionally Abused:   Marland Kitchen Physically Abused:   . Sexually Abused:     Family History  Problem Relation Age of Onset  . Diabetes Mother   . Diabetes Sister     No past surgical history on file.  ROS: Review of Systems Negative except as stated above  PHYSICAL EXAM: Vitals with BMI 06/28/2020 04/06/2020 01/19/2020  Height 5' 2"  - -  Weight 126 lbs 126 lbs 3 oz 127 lbs 6 oz  BMI 24.40 - -  Systolic 102 725 366  Diastolic 79 75 81  Pulse 76 83 74    Physical Exam General appearance - alert, well appearing, and in no distress and oriented to person, place, and time Mental status - alert, oriented to person, place, and time Neck - supple, no significant adenopathy Lymphatics - no palpable lymphadenopathy, no hepatosplenomegaly Chest - clear to auscultation, no wheezes, rales or rhonchi, symmetric air entry, no tachypnea, retractions or cyanosis Heart - normal rate, regular rhythm, normal S1, S2, no murmurs, rubs, clicks or gallops Neurological - alert, oriented, normal speech, no focal findings or  movement disorder noted, cranial nerves II through XII intact, DTR's normal and symmetric, motor and sensory grossly normal bilaterally, normal muscle tone, no tremors, strength 5/5, Romberg sign negative, normal gait and station Musculoskeletal - no joint tenderness, deformity or swelling, no muscular tenderness noted, full range of motion without pain Extremities - peripheral pulses normal, no pedal edema, no clubbing or cyanosis, feet normal, good pulses, normal color, temperature and sensation Skin - normal coloration and turgor, no rashes, no suspicious skin lesions noted  Results for orders placed or performed in visit on 06/28/20  Glucose (CBG)  Result Value Ref Range   POC Glucose 201 (A) 70 - 99 mg/dl  POCT glycosylated hemoglobin (Hb A1C)  Result Value Ref Range   Hemoglobin A1C 11.2 (A) 4.0 - 5.6 %   HbA1c POC (<> result, manual entry)     HbA1c, POC (prediabetic range)     HbA1c, POC (controlled diabetic range)      ASSESSMENT AND PLAN: 1. Uncontrolled type 2 diabetes mellitus with hyperglycemia, without long-term current use of insulin ([place]): -Diabetes uncontrolled. Hemoglobin A1C not at goal today 11.2%, goal < 7%. Previous hemoglobin A1C 10.9%. CBG elevated today, fasting, patient asymptomatic.  -Increase Amaryl to 4 mg twice daily. Increase Metformin to 1000 mg twice daily.  -Counseled patient that it is recommended to begin an insulin regimen or an injectable anti-diabetic medication considering that her A1C continues to increase. Patient declines insulin therapy/injectable anti-diabetic medications. Reports she was on Lantus in the past and it made her dizzy when she took it. Patient reports she may consider adding insulin therapy/injectable anti-diabetic at her next visit if her A1C is still higher than normal.  -Ordering a replacement glucometer and refills on lancets and testing strips.  -To achieve an A1C goal of less than or equal to 7.0 percent, a fasting blood sugar of  80 to 130 mg/dL and a postprandial glucose (90 to 120 minutes after a meal) less than 180 mg/dL. In the event of sugars less than 60 mg/dl or greater than 400 mg/dl please notify the clinic ASAP. It is recommended that you undergo annual eye exams and annual foot exams. -Patient reports she drinks coffee  all day using regular-sugar and cream. Counseled patient to try using a sugar substitute such as an artifical sweetener with coffee instead. Reports she will try.Discussed the importance of healthy eating habits, low-carbohydrate diet, low-sugar diet, regular aerobic exercise (at least 150 minutes a week as tolerated) and medication compliance to achieve or maintain control of diabetes. -Last CMP obtained 04/06/2020. -Last CBC obtained 04/06/2020. -Keep appointment with dietician for diabetes education scheduled 07/06/2020. -Follow-up with your primary physician in 3 months or sooner if needed. - Glucose (CBG) - POCT glycosylated hemoglobin (Hb A1C) - glimepiride (AMARYL) 4 MG tablet; Take 1 tablet (4 mg total) by mouth in the morning and at bedtime. Or before the first meal of the day  Dispense: 60 tablet; Refill: 2 - metFORMIN (GLUCOPHAGE) 1000 MG tablet; Take 1 tablet (1,000 mg total) by mouth 2 (two) times daily with a meal.  Dispense: 60 tablet; Refill: 2 - TRUEplus Lancets 28G MISC; Use as directed  Dispense: 100 each; Refill: 12 - glucose blood (TRUE METRIX BLOOD GLUCOSE TEST) test strip; Use as instructed  Dispense: 100 each; Refill: 12 - Blood Glucose Monitoring Suppl (TRUE METRIX METER) w/Device KIT; Use as directed  Dispense: 1 kit; Refill: 0  2. Mixed hyperlipidemia: -Practice low-fat heart healthy diet and at least 150 minutes of moderate intensity exercise weekly as tolerated.  -Continue Pravastatin as prescribed.  -Follow-up with primary physician in 3 months or sooner if needed.  -The 10-year ASCVD risk score Mikey Bussing DC Brooke Bonito., et al., 2013) is: 6.4%   Values used to calculate the score:      Age: 75 years     Sex: Female     Is Non-Hispanic African American: No     Diabetic: Yes     Tobacco smoker: No     Systolic Blood Pressure: 330 mmHg     Is BP treated: No     HDL Cholesterol: 38 mg/dL     Total Cholesterol: 290 mg/dL - pravastatin (PRAVACHOL) 20 MG tablet; Take 1 tablet (20 mg total) by mouth daily.  Dispense: 90 tablet; Refill: 0  3. Anxiety and depression: -Referral to Social Work for counseling and community resources.  -Reports she tried Hydroxyzine in the past which cause more anxiety for her. -Patient reports she drinks coffee all day using regular-sugar and regular-cream. Counseled patient to try using a sugar substitute such as an artifical sweetener with coffee instead. Counseled patient that drinking caffeine may also be contributing to symptoms of anxiety. -Denies thoughts of self-harm and suicidal ideation. -Fluoxetine for anxiety and depression. Counseled patient that it may take 4 to 6 weeks for maximum potential to take affect. Counseled patient that should she experience any side effects such as but not limited to suicidality, increased anxiety, and/or depression to notify immediately. Patient was given clear instructions to go to Emergency Department or return to medical center if symptoms don't improve, worsen, or new problems develop.The patient verbalized understanding. -Follow-up with primary physician in 4 to 6 weeks or sooner if needed. - Ambulatory referral to Social Work - FLUoxetine (PROZAC) 10 MG tablet; Take 1 tablet (10 mg total) by mouth daily.  Dispense: 30 tablet; Refill: 1  4. Chest pain, unspecified type: -Patient without chest pain during today's visit. 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  -Denies chest pressure, palpitations (unless anxiety related), and shortness of breath. Denies radiation to arm, shoulder, and back. Denies numbness sensation,  diaphoresis, and  heartburn. -May take over-the-counter NSAIDs or Tylenol to help with this.  -EKG today with normal sinus rhythm, low voltage QRS, and non-specific T wave abnormality. Previous EKG in 2019 normal sinus rhythm. EKG overall without significant abnormality. -Patient was given clear instructions to go to Emergency Department or return to medical center if symptoms don't improve, worsen, or new problems develop.The patient verbalized understanding. - EKG 12-Lead  5. Diabetic mononeuropathy associated with type 2 diabetes mellitus ([place]): -Gabapentin for diabetic neuropathy. - gabapentin (NEURONTIN) 100 MG capsule; Take 1 capsule (100 mg total) by mouth at bedtime.  Dispense: 90 capsule; Refill: 0  6. Language barrier: -Patient refused [employer] today for assistance. Accompanied by her daughter, Dorthula Rue, and prefers her to interpret.  Patient was given the opportunity to ask questions.  Patient verbalized understanding of the plan and was able to repeat key elements of the plan. Patient was given clear instructions to go to Emergency Department or return to medical center if symptoms don't improve, worsen, or new problems develop.The patient verbalized understanding.    Camillia Herter, NP

## 2020-06-28 NOTE — Patient Instructions (Addendum)
Aumente el Amaryl y la Metformina para la diabetes. Prozac para la ansiedad / depresin. Seguimiento en 4 a 6 semanas para ansiedad / depresin. Remisin al [facility] social por ansiedad / depresin. Contine con pravastatina para el colesterol. Haga un seguimiento con el mdico de atencin primaria en 3 meses o antes si es necesario para el manejo de afecciones crnicas.  Increase Amaryl and Metformin for diabetes. Prozac for anxiety/depression. Follow-up in 4 to 6 weeks for anxiety/depression. Referral to Social Work for anxiety/depression. Continue Pravastatin for cholesterol. Follow-up with primary physician in 3 months or sooner if needed for management of chronic conditions.  Diabetes Basics  Diabetes (diabetes mellitus) is a long-term (chronic) disease. It occurs when the body does not properly use sugar (glucose) that is released from food after you eat. Diabetes may be caused by one or both of these problems:  Your pancreas does not make enough of a hormone called insulin.  Your body does not react in a normal way to insulin that it makes. Insulin lets sugars (glucose) go into cells in your body. This gives you energy. If you have diabetes, sugars cannot get into cells. This causes high blood sugar (hyperglycemia). Follow these instructions at home: How is diabetes treated? You may need to take insulin or other diabetes medicines daily to keep your blood sugar in balance. Take your diabetes medicines every day as told by your doctor. List your diabetes medicines here: Diabetes medicines  Name of medicine: ______________________________ ? Amount (dose): _______________ Time (a.m./p.m.): _______________ Notes: ___________________________________  Name of medicine: ______________________________ ? Amount (dose): _______________ Time (a.m./p.m.): _______________ Notes: ___________________________________  Name of medicine: ______________________________ ? Amount (dose): _______________  Time (a.m./p.m.): _______________ Notes: ___________________________________ If you use insulin, you will learn how to give yourself insulin by injection. You may need to adjust the amount based on the food that you eat. List the types of insulin you use here: Insulin  Insulin type: ______________________________ ? Amount (dose): _______________ Time (a.m./p.m.): _______________ Notes: ___________________________________  Insulin type: ______________________________ ? Amount (dose): _______________ Time (a.m./p.m.): _______________ Notes: ___________________________________  Insulin type: ______________________________ ? Amount (dose): _______________ Time (a.m./p.m.): _______________ Notes: ___________________________________  Insulin type: ______________________________ ? Amount (dose): _______________ Time (a.m./p.m.): _______________ Notes: ___________________________________  Insulin type: ______________________________ ? Amount (dose): _______________ Time (a.m./p.m.): _______________ Notes: ___________________________________ How do I manage my blood sugar?  Check your blood sugar levels using a blood glucose monitor as directed by your doctor. Your doctor will set treatment goals for you. Generally, you should have these blood sugar levels:  Before meals (preprandial): 80-130 mg/dL (7.1-6.9 mmol/L).  After meals (postprandial): below 180 mg/dL (10 mmol/L).  A1c level: less than 7%. Write down the times that you will check your blood sugar levels: Blood sugar checks  Time: _______________ Notes: ___________________________________  Time: _______________ Notes: ___________________________________  Time: _______________ Notes: ___________________________________  Time: _______________ Notes: ___________________________________  Time: _______________ Notes: ___________________________________  Time: _______________ Notes: ___________________________________  What do I  need to know about low blood sugar? Low blood sugar is called hypoglycemia. This is when blood sugar is at or below 70 mg/dL (3.9 mmol/L). Symptoms may include:  Feeling: ? Hungry. ? Worried or nervous (anxious). ? Sweaty and clammy. ? Confused. ? Dizzy. ? Sleepy. ? Sick to your stomach (nauseous).  Having: ? A fast heartbeat. ? A headache. ? A change in your vision. ? Tingling or no feeling (numbness) around the mouth, lips, or tongue. ? Jerky movements that you cannot control (seizure).  Having trouble with: ?  Moving (coordination). ? Sleeping. ? Passing out (fainting). ? Getting upset easily (irritability). Treating low blood sugar To treat low blood sugar, eat or drink something sugary right away. If you can think clearly and swallow safely, follow the 15:15 rule:  Take 15 grams of a fast-acting carb (carbohydrate). Talk with your doctor about how much you should take.  Some fast-acting carbs are: ? Sugar tablets (glucose pills). Take 3-4 glucose pills. ? 6-8 pieces of hard candy. ? 4-6 oz (120-150 mL) of fruit juice. ? 4-6 oz (120-150 mL) of regular (not diet) soda. ? 1 Tbsp (15 mL) honey or sugar.  Check your blood sugar 15 minutes after you take the carb.  If your blood sugar is still at or below 70 mg/dL (3.9 mmol/L), take 15 grams of a carb again.  If your blood sugar does not go above 70 mg/dL (3.9 mmol/L) after 3 tries, get help right away.  After your blood sugar goes back to normal, eat a meal or a snack within 1 hour. Treating very low blood sugar If your blood sugar is at or below 54 mg/dL (3 mmol/L), you have very low blood sugar (severe hypoglycemia). This is an emergency. Do not wait to see if the symptoms will go away. Get medical help right away. Call your local emergency services (911 in the [place] Do not drive yourself to the hospital. Questions to ask your health care provider  Do I need to meet with a diabetes educator?  What equipment will I  need to care for myself at home?  What diabetes medicines do I need? When should I take them?  How often do I need to check my blood sugar?  What number can I call if I have questions?  When is my next doctor's visit?  Where can I find a support group for people with diabetes? Where to find more information  American Diabetes Association: www.diabetes.org  American Association of Diabetes Educators: www.diabeteseducator.org/patient-resources Contact a doctor if:  Your blood sugar is at or above 240 mg/dL (58.8 mmol/L) for 2 days in a row.  You have been sick or have had a fever for 2 days or more, and you are not getting better.  You have any of these problems for more than 6 hours: ? You cannot eat or drink. ? You feel sick to your stomach (nauseous). ? You throw up (vomit). ? You have watery poop (diarrhea). Get help right away if:  Your blood sugar is lower than 54 mg/dL (3 mmol/L).  You get confused.  You have trouble: ? Thinking clearly. ? Breathing. Summary  Diabetes (diabetes mellitus) is a long-term (chronic) disease. It occurs when the body does not properly use sugar (glucose) that is released from food after digestion.  Take insulin and diabetes medicines as told.  Check your blood sugar every day, as often as told.  Keep all follow-up visits as told by your doctor. This is important. This information is not intended to replace advice given to you by your health care provider. Make sure you discuss any questions you have with your health care provider. Document Revised: 08/20/2019 Document Reviewed: 03/01/2018 Elsevier Patient Education  2020 [employer].

## 2020-06-29 NOTE — Progress Notes (Signed)
Blood glucose and hemoglobin A1C discussed in clinic.

## 2020-07-06 ENCOUNTER — Ambulatory Visit: Payer: Self-pay | Admitting: Dietician

## 2020-07-07 ENCOUNTER — Telehealth: Payer: Self-pay | Admitting: Licensed Clinical Social Worker

## 2020-07-07 NOTE — Telephone Encounter (Signed)
Call placed to patient regarding [facility] referral. LCSW left message requesting a return call.  

## 2020-08-23 ENCOUNTER — Other Ambulatory Visit: Payer: Self-pay

## 2020-08-23 ENCOUNTER — Encounter: Payer: Self-pay | Attending: Family | Admitting: Skilled Nursing Facility1

## 2020-08-23 ENCOUNTER — Encounter: Payer: Self-pay | Admitting: Skilled Nursing Facility1

## 2020-08-23 DIAGNOSIS — E119 Type 2 diabetes mellitus without complications: Secondary | ICD-10-CM | POA: Insufficient documentation

## 2020-08-23 NOTE — Progress Notes (Signed)
Diabetes Self-Management Education  Visit Type: First/Initial  08/24/2020  [name], identified by name and date of birth, is a 49 y.o. female with a diagnosis of Diabetes: Type 2.   ASSESSMENT  Height 5\' 2"  (1.575 m), weight 127 lb 8 oz (57.8 kg). Body mass index is 23.32 kg/m.  Pts daughter states her mom is addicted to coffee which increases her anxiety: 3 large cups of coffee a day.  Pt states she does not check her blood sugar.  [facility] was offered for pt to get mental health counseling. Pt was tearful throughout appt. pts daughter states her mom internalizes a lot of stress and other peoples stress too.   Goals: Check your blood sugar every other day Look into mental health counseling; crisis line number offered as well Eat every 3-5 hours starting within 1-1.5 hours after waking   Diabetes Self-Management Education - 08/24/20 0839      Visit Information   Visit Type First/Initial      Initial Visit   Diabetes Type Type 2    Are you currently following a meal plan? No    Are you taking your medications as prescribed? Yes      Health Coping   How would you rate your overall health? Poor      Psychosocial Assessment   Patient Belief/Attitude about Diabetes Afraid    Self-care barriers English as a second language    Self-management support Family    Other persons present Family Member    Patient Concerns Nutrition/Meal planning    Special Needs None    Learning Readiness Contemplating      Pre-Education Assessment   Patient understands the diabetes disease and treatment process. Needs Instruction    Patient understands incorporating nutritional management into lifestyle. Needs Instruction    Patient undertands incorporating physical activity into lifestyle. Needs Instruction    Patient understands using medications safely. Needs Instruction    Patient understands monitoring blood glucose, interpreting and using results  Needs Instruction    Patient understands prevention, detection, and treatment of acute complications. Needs Instruction    Patient understands prevention, detection, and treatment of chronic complications. Needs Instruction    Patient understands how to develop strategies to address psychosocial issues. Needs Instruction    Patient understands how to develop strategies to promote health/change behavior. Needs Instruction      Complications   Last HgB A1C per patient/outside source 10 %    How often do you check your blood sugar? 0 times/day (not testing)    Have you had a dilated eye exam in the past 12 months? Yes    Have you had a dental exam in the past 12 months? Yes    Are you checking your feet? Yes    How many days per week are you checking your feet? 7      Exercise   Exercise Type ADL's      Patient Education   Previous Diabetes Education No      Individualized Goals (developed by patient)   Nutrition General guidelines for healthy choices and portions discussed    Physical Activity Exercise 5-7 days per week;30 minutes per day    Medications take my medication as prescribed    Monitoring  test my blood glucose as discussed;test blood glucose pre and post meals as discussed      Post-Education Assessment   Patient understands the diabetes disease and treatment process. Demonstrates understanding / competency  Patient understands incorporating nutritional management into lifestyle. Demonstrates understanding / competency    Patient undertands incorporating physical activity into lifestyle. Demonstrates understanding / competency    Patient understands using medications safely. Demonstrates understanding / competency    Patient understands monitoring blood glucose, interpreting and using results Demonstrates understanding / competency    Patient understands prevention, detection, and treatment of acute complications. Demonstrates understanding / competency    Patient  understands prevention, detection, and treatment of chronic complications. Demonstrates understanding / competency    Patient understands how to develop strategies to address psychosocial issues. Demonstrates understanding / competency    Patient understands how to develop strategies to promote health/change behavior. Demonstrates understanding / competency      Outcomes   Future DMSE 2 wks    Program Status Completed           Individualized Plan for Diabetes Self-Management Training:   Learning Objective:  Patient will have a greater understanding of diabetes self-management. Patient education plan is to attend individual and/or group sessions per assessed needs and concerns.   Plan:   There are no Patient Instructions on file for this visit.  Expected Outcomes:     Education material provided: ADA - How to Thrive: A Guide for Your Journey with Diabetes, My Plate and Snack sheet  If problems or questions, patient to contact team via:  Phone and Email  Future DSME appointment: 2 wks

## 2020-09-13 ENCOUNTER — Ambulatory Visit: Payer: Self-pay | Admitting: Skilled Nursing Facility1

## 2020-09-16 ENCOUNTER — Ambulatory Visit: Payer: Self-pay | Admitting: Physician Assistant

## 2020-10-07 ENCOUNTER — Ambulatory Visit: Payer: Self-pay | Admitting: Internal Medicine

## 2020-10-28 ENCOUNTER — Ambulatory Visit: Payer: Self-pay

## 2020-10-28 NOTE — Telephone Encounter (Addendum)
Using Spanish interpreter [place], # [phone], pt. Reports she had 2-3 episodes of chest pain lasting less than 5 minutes yesterday. Pain was on left side of chest. 6/10 on pain scale. No pain today. No radiation, no shortness of breath, dizziness or nausea. Request appointment for diabetes follow up as well. Instructed to go to ED if pain returns.No availability. Please advise pt.  Reason for Disposition . [1] Chest pain lasts > 5 minutes AND [2] occurred > 3 days ago (72 hours) AND [3] NO chest pain or cardiac symptoms now  Answer Assessment - Initial Assessment Questions 1. LOCATION: "Where does it hurt?"       Middle and left 2. RADIATION: "Does the pain go anywhere else?" (e.g., into neck, jaw, arms, back)     No 3. ONSET: "When did the chest pain begin?" (Minutes, hours or days)      Yesterday 4. PATTERN "Does the pain come and go, or has it been constant since it started?"  "Does it get worse with exertion?"      Comes and goes 5. DURATION: "How long does it last" (e.g., seconds, minutes, hours)     Not long - less than 5 minutes 6. SEVERITY: "How bad is the pain?"  (e.g., Scale 1-10; mild, moderate, or severe)    - MILD (1-3): doesn't interfere with normal activities     - MODERATE (4-7): interferes with normal activities or awakens from sleep    - SEVERE (8-10): excruciating pain, unable to do any normal activities       6 7. CARDIAC RISK FACTORS: "Do you have any history of heart problems or risk factors for heart disease?" (e.g., angina, prior heart attack; diabetes, high blood pressure, high cholesterol, smoker, or strong family history of heart disease)     No 8. PULMONARY RISK FACTORS: "Do you have any history of lung disease?"  (e.g., blood clots in lung, asthma, emphysema, birth control pills)     No 9. CAUSE: "What do you think is causing the chest pain?"     Unsure 10. OTHER SYMPTOMS: "Do you have any other symptoms?" (e.g., dizziness, nausea, vomiting, sweating, fever,  difficulty breathing, cough)       No 11. PREGNANCY: "Is there any chance you are pregnant?" "When was your last menstrual period?"       No  Protocols used: CHEST PAIN-A-AH

## 2020-10-28 NOTE — Telephone Encounter (Signed)
Pt was triaged by PCP nurse!

## 2020-12-02 ENCOUNTER — Other Ambulatory Visit: Payer: Self-pay

## 2020-12-02 ENCOUNTER — Ambulatory Visit: Payer: Self-pay | Attending: Internal Medicine | Admitting: Internal Medicine

## 2020-12-02 ENCOUNTER — Encounter: Payer: Self-pay | Admitting: Internal Medicine

## 2020-12-02 VITALS — BP 124/77 | HR 71 | Temp 98.3°F | Resp 16 | Wt 124.6 lb

## 2020-12-02 DIAGNOSIS — F32A Depression, unspecified: Secondary | ICD-10-CM

## 2020-12-02 DIAGNOSIS — Z23 Encounter for immunization: Secondary | ICD-10-CM

## 2020-12-02 DIAGNOSIS — F419 Anxiety disorder, unspecified: Secondary | ICD-10-CM

## 2020-12-02 DIAGNOSIS — E1165 Type 2 diabetes mellitus with hyperglycemia: Secondary | ICD-10-CM

## 2020-12-02 LAB — POCT GLYCOSYLATED HEMOGLOBIN (HGB A1C): HbA1c, POC (controlled diabetic range): 11.7 % — AB (ref 0.0–7.0)

## 2020-12-02 LAB — GLUCOSE, POCT (MANUAL RESULT ENTRY): POC Glucose: 248 mg/dl — AB (ref 70–99)

## 2020-12-02 NOTE — Progress Notes (Signed)
Patient ID: Faith Alvarez, female    DOB: 14-Jul-1971  MRN: [id number]  CC: Diabetes   Subjective: Faith Alvarez is a 49 y.o. female who presents for chronic ds management. Daughter Dorthula Rue is with her and interprets Her concerns today include:  DM with neuropathy, anx/dep, HL, noncompliance with medications, recurrent vaginal yeast  DM: Results for orders placed or performed in visit on 12/02/20  POCT glucose (manual entry)  Result Value Ref Range   POC Glucose 248 (A) 70 - 99 mg/dl  POCT glycosylated hemoglobin (Hb A1C)  Result Value Ref Range   Hemoglobin A1C     HbA1c POC (<> result, manual entry)     HbA1c, POC (prediabetic range)     HbA1c, POC (controlled diabetic range) 11.7 (A) 0.0 - 7.0 %   Patient was last seen by our nurse practitioner in July of this year.  She was not taking her diabetic medications consistently at that point.  She had declined being started on insulin.  She was encouraged to take the Metformin and Amaryl consistently. Today she tells me that she is doing better and that she is taking her medicines about 2-3 times a week.  She forgets to take it other times.  Daughter tells me that she does have a pillbox but does not use it Cutting back on greasy and salty foods.  She did have an appointment with the nutritionist recently. Not much exercise  Anx/dep: Started on Prozac on last visit.  She is not taking this consistently either stating that she forgets to take it.  LCSW did call her but pt did not return call.  She is wanting to know whether we can set up an appointment for her to see the LCSW  HM: needs flu shot.  Completed COVID vaccine series Patient Active Problem List   Diagnosis Date Noted  . Anxiety and depression 10/16/2019  . Current moderate episode of major depressive disorder ([place]) 10/14/2018  . 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 Grief reaction 10/22/2017  . Diabetic mononeuropathy  associated with type 2 diabetes mellitus ([place]) 10/22/2017  . Anxiety 10/22/2017  . Uncontrolled type 2 diabetes mellitus ([place]) 09/04/2017  . Mixed hyperlipidemia 07/25/2017  . Malaise and fatigue 04/01/2017     Current Outpatient Medications on File Prior to Visit  Medication Sig Dispense Refill  . amoxicillin (AMOXIL) 500 MG capsule Take 1 capsule (500 mg total) by mouth 3 (three) times daily. (Patient not taking: No sig reported) 15 capsule 0  . Blood Glucose Monitoring Suppl (TRUE METRIX METER) w/Device KIT Use as directed 1 kit 0  . fluconazole (DIFLUCAN) 150 MG tablet Take 1 tablet (150 mg total) by mouth once a week. (Patient not taking: No sig reported) 2 tablet 0  . FLUoxetine (PROZAC) 10 MG tablet Take 1 tablet (10 mg total) by mouth daily. 30 tablet 1  . gabapentin (NEURONTIN) 100 MG capsule Take 1 capsule (100 mg total) by mouth at bedtime. 90 capsule 0  . glimepiride (AMARYL) 4 MG tablet Take 1 tablet (4 mg total) by mouth in the morning and at bedtime. Or before the first meal of the day 60 tablet 2  . glucose blood (TRUE METRIX BLOOD GLUCOSE TEST) test strip Use as instructed 100 each 12  . hydrOXYzine (ATARAX/VISTARIL) 25 MG tablet Take 1 tablet (25 mg total) by mouth 3 (three) times daily as needed. (Patient not taking: Reported on 04/06/2020) 90 tablet 1  . Insulin Pen  Needle (B-D UF III MINI PEN NEEDLES) 31G X 5 MM MISC Use as instructed. Inject into the skin once nightly. (Patient not taking: Reported on 04/06/2020) 100 each 1  . metFORMIN (GLUCOPHAGE) 1000 MG tablet Take 1 tablet (1,000 mg total) by mouth 2 (two) times daily with a meal. 60 tablet 2  . nystatin-triamcinolone ointment (MYCOLOG) Apply 1 application topically 2 (two) times daily. Apply to the outside of the vaginal area twice a day as needed.  Do not insert cream into the vagina. (Patient not taking: Reported on 04/06/2020) 30 g 1  . omega-3 acid ethyl esters (LOVAZA) 1 g capsule Take 2 capsules (2 g total) by mouth  2 (two) times daily. 120 capsule 2  . omeprazole (PRILOSEC) 20 MG capsule Take 1 capsule (20 mg total) by mouth daily. To reduce stomach acid (Patient not taking: Reported on 04/06/2020) 90 capsule 1  . pravastatin (PRAVACHOL) 20 MG tablet Take 1 tablet (20 mg total) by mouth daily. 90 tablet 0  . TRUEplus Lancets 28G MISC Use as directed 100 each 12   No current facility-administered medications on file prior to visit.    No Known Allergies  Social History   Socioeconomic History  . Marital status: Married    Spouse name: Not on file  . Number of children: Not on file  . Years of education: Not on file  . Highest education level: Not on file  Occupational History  . Not on file  Tobacco Use  . Smoking status: Never Smoker  . Smokeless tobacco: Never Used  Substance and Sexual Activity  . Alcohol use: Never  . Drug use: Never  . Sexual activity: Yes  Other Topics Concern  . Not on file  Social History Narrative  . Not on file   Social Determinants of Health   Financial Resource Strain: Not on file  Food Insecurity: Not on file  Transportation Needs: Not on file  Physical Activity: Not on file  Stress: Not on file  Social Connections: Not on file  Intimate Partner Violence: Not on file    Family History  Problem Relation Age of Onset  . Diabetes Mother   . Diabetes Sister     No past surgical history on file.  ROS: Review of Systems Negative except as stated above  PHYSICAL EXAM: BP 124/77   Pulse 71   Temp 98.3 F (36.8 C)   Resp 16   Wt 124 lb 9.6 oz (56.5 kg)   SpO2 99%   BMI 22.79 kg/m   Physical Exam General appearance - alert, well appearing, and in no distress Mental status - normal mood, behavior, speech, dress, motor activity, and thought processes Neck - supple, no significant adenopathy Chest - clear to auscultation, no wheezes, rales or rhonchi, symmetric air entry Heart - normal rate, regular rhythm, normal S1, S2, no murmurs, rubs,  clicks or gallops Extremities - peripheral pulses normal, no pedal edema, no clubbing or cyanosis Leap nl Depression screen [facility] 2/9 12/02/2020 08/23/2020 06/28/2020  Decreased Interest 2 0 1  Down, Depressed, Hopeless 1 0 1  PHQ - 2 Score 3 0 2  Altered sleeping 2 - 1  Tired, decreased energy 2 - 3  Change in appetite 2 - 2  Feeling bad or failure about yourself  3 - 1  Trouble concentrating 1 - 0  Moving slowly or fidgety/restless 1 - 0  Suicidal thoughts 0 - 0  PHQ-9 Score 14 - 9  Some recent data might  be hidden   GAD 7 : Generalized Anxiety Score 12/02/2020 06/28/2020 01/19/2020 10/16/2019  Nervous, Anxious, on Edge 1 2 1 2   Control/stop worrying 1 2 2 2   Worry too much - different things 1 2 2 2   Trouble relaxing 1 1 2 3   Restless 1 1 2 1   Easily annoyed or irritable 2 3 3 1   Afraid - awful might happen - 0 0 0  Total GAD 7 Score - 11 12 11      CMP Latest Ref Rng & Units 04/06/2020 07/28/2019 03/17/2019  Glucose 65 - 99 mg/dL 248(H) 309(H) 300(H)  BUN 6 - 24 mg/dL 17 11 13   Creatinine 0.57 - 1.00 mg/dL 0.50(L) 0.64 0.53(L)  Sodium 134 - 144 mmol/L 136 136 133(L)  Potassium 3.5 - 5.2 mmol/L 4.4 4.5 4.0  Chloride 96 - 106 mmol/L 100 99 98  CO2 20 - 29 mmol/L 25 26 22   Calcium 8.7 - 10.2 mg/dL 8.8 9.2 9.2  Total Protein 6.0 - 8.5 g/dL 6.7 6.7 -  Total Bilirubin 0.0 - 1.2 mg/dL <0.2 0.3 -  Alkaline Phos 39 - 117 IU/L 125(H) 117 -  AST 0 - 40 IU/L 12 12 -  ALT 0 - 32 IU/L 17 12 -   Lipid Panel     Component Value Date/Time   CHOL 290 (H) 07/28/2019 1622   TRIG 562 (HH) 07/28/2019 1622   HDL 38 (L) 07/28/2019 1622   CHOLHDL 7.6 (H) 07/28/2019 1622   LDLCALC Comment 07/28/2019 1622    CBC    Component Value Date/Time   WBC 7.6 04/06/2020 1518   WBC 8.0 04/30/2017 1135   RBC 4.47 04/06/2020 1518   RBC 4.33 04/30/2017 1135   HGB 14.0 04/06/2020 1518   HCT 42.6 04/06/2020 1518   PLT 319 07/28/2019 1622   MCV 95 04/06/2020 1518   MCH 31.3 04/06/2020 1518   MCH 30.5  04/30/2017 1135   MCHC 32.9 04/06/2020 1518   MCHC 33.8 04/30/2017 1135   RDW 12.3 04/06/2020 1518   LYMPHSABS 2.7 04/06/2020 1518   MONOABS 0.5 04/30/2017 1135   EOSABS 0.1 04/06/2020 1518   BASOSABS 0.0 04/06/2020 1518    ASSESSMENT AND PLAN: 1. Uncontrolled type 2 diabetes mellitus with hyperglycemia, without long-term current use of insulin (HCC) Discussed the importance of healthy eating habits, regular aerobic exercise (at least 150 minutes a week as tolerated) and medication compliance to achieve or maintain control of diabetes. -Strongly recommend that she set up her med box once a week and keep the box in a place that is very visible to her so that it helps her to remember to take her medications. - POCT glucose (manual entry) - POCT glycosylated hemoglobin (Hb A1C)  2. Anxiety and depression Inform patient that and treated depression can also cause memory issues so it is worth trying the Prozac to see whether her mood and memory improves.  3. Need for immunization against influenza - Flu Vaccine QUAD 36+ mos IM    Patient was given the opportunity to ask questions.  Patient verbalized understanding of the plan and was able to repeat key elements of the plan.   Orders Placed This Encounter  Procedures  . POCT glucose (manual entry)  . POCT glycosylated hemoglobin (Hb A1C)     Requested Prescriptions    No prescriptions requested or ordered in this encounter    No follow-ups on file.  Karle Plumber, MD, FACP

## 2020-12-02 NOTE — Patient Instructions (Signed)
Influenza Virus Vaccine injection (Fluarix) °¿Qué es este medicamento? °La VACUNA ANTIGRIPAL ayuda a disminuir el riesgo de contraer la influenza, también conocida como la gripe. La vacuna solo ayuda a protegerle contra algunas cepas de influenza. Esta vacuna no ayuda a reducir el riesgo de contraer influenza pandémica H1N1. °Este medicamento puede ser utilizado para otros usos; si tiene alguna pregunta consulte con su proveedor de atención médica o con su farmacéutico. °MARCAS COMUNES: Fluarix, Fluzone °¿Qué le debo informar a mi profesional de la salud antes de tomar este medicamento? °Necesita saber si usted presenta alguno de los siguientes problemas o situaciones: °· trastorno de sangrado como hemofilia °· fiebre o infección °· síndrome de Guillain-Barre u otros problemas neurológicos °· problemas del sistema inmunológico °· infección por el virus de la inmunodeficiencia humana (VIH) o SIDA °· niveles bajos de plaquetas en la sangre °· esclerosis múltiple °· una reacción alérgica o inusual a las vacunas antigripales, a los huevos, proteínas de pollo, al látex, a la gentamicina, a otros medicamentos, alimentos, colorantes o conservantes °· si está embarazada o buscando quedar embarazada °· si está amamantando a un bebé °¿Cómo debo utilizar este medicamento? °Esta vacuna se administra mediante inyección por vía intramuscular. Lo administra un profesional de la salud. °Recibirá una copia de información escrita sobre la vacuna antes de cada vacuna. Asegúrese de leer este folleto cada vez cuidadosamente. Este folleto puede cambiar con frecuencia. °Hable con su pediatra para informarse acerca del uso de este medicamento en niños. Puede requerir atención especial. °Sobredosis: Póngase en contacto inmediatamente con un centro toxicológico o una sala de urgencia si usted cree que haya tomado demasiado medicamento. °ATENCIÓN: Este medicamento es solo para usted. No comparta este medicamento con nadie. °¿Qué sucede si me  olvido de una dosis? °No se aplica en este caso. °¿Qué puede interactuar con este medicamento? °· quimioterapia o radioterapia °· medicamentos que suprimen el sistema inmunológico, tales como etanercept, anakinra, infliximab y adalimumab °· medicamentos que tratan o previenen coágulos sanguíneos, como warfarina °· fenitoína °· medicamentos esteroideos, como la prednisona o la cortisona °· teofilina °· vacunas °Puede ser que esta lista no menciona todas las posibles interacciones. Informe a su profesional de la salud de todos los productos a base de hierbas, medicamentos de venta libre o suplementos nutritivos que esté tomando. Si usted fuma, consume bebidas alcohólicas o si utiliza drogas ilegales, indíqueselo también a su profesional de la salud. Algunas sustancias pueden interactuar con su medicamento. °¿A qué debo estar atento al usar este medicamento? °Informe a su médico o a su profesional de la salud sobre todos los efectos secundarios que persistan después de 3 días. Llame a su proveedor de atención médica si se presentan síntomas inusuales dentro de las 6 semanas posteriores a la vacunación. °Es posible que todavía pueda contraer la gripe, pero la enfermedad no será tan fuerte como normalmente. No puede contraer la gripe de esta vacuna. La vacuna antigripal no le protege contra resfríos u otras enfermedades que pueden causar fiebre. Debe vacunarse cada año. °¿Qué efectos secundarios puedo tener al utilizar este medicamento? °Efectos secundarios que debe informar a su médico o a su profesional de la salud tan pronto como sea posible: °· reacciones alérgicas como erupción cutánea, picazón o urticarias, hinchazón de la cara, labios o lengua °Efectos secundarios que, por lo general, no requieren atención médica (debe informarlos a su médico o a su profesional de la salud si persisten o si son molestos): °· fiebre °· dolor de cabeza °· molestias y dolores musculares °·   dolor, sensibilidad, enrojecimiento o  hinchazón en el lugar de la inyección °· cansancio o debilidad °Puede ser que esta lista no menciona todos los posibles efectos secundarios. Comuníquese a su médico por asesoramiento médico sobre los efectos secundarios. Usted puede informar los efectos secundarios a la FDA por teléfono al 1-800-FDA-1088. °¿Dónde debo guardar mi medicina? °Esta vacuna se administra solamente en clínicas, farmacias, consultorio médico u otro consultorio de un profesional de la salud y no necesitará guardarlo en su domicilio. °ATENCIÓN: Este folleto es un resumen. Puede ser que no cubra toda la posible información. Si usted tiene preguntas acerca de esta medicina, consulte con su médico, su farmacéutico o su profesional de la salud. °© 2020 Elsevier/Gold Standard (2010-05-31 15:31:40) ° °

## 2021-10-31 ENCOUNTER — Emergency Department (HOSPITAL_COMMUNITY): Payer: Self-pay

## 2021-10-31 ENCOUNTER — Encounter (HOSPITAL_COMMUNITY): Payer: Self-pay | Admitting: Emergency Medicine

## 2021-10-31 ENCOUNTER — Emergency Department (HOSPITAL_COMMUNITY)
Admission: EM | Admit: 2021-10-31 | Discharge: 2021-10-31 | Disposition: A | Discharge status: Home / Self Care | Payer: Self-pay | Attending: Emergency Medicine | Admitting: Emergency Medicine

## 2021-10-31 DIAGNOSIS — W010XXA Fall on same level from slipping, tripping and stumbling without subsequent striking against object, initial encounter: Secondary | ICD-10-CM | POA: Insufficient documentation

## 2021-10-31 DIAGNOSIS — E1140 Type 2 diabetes mellitus with diabetic neuropathy, unspecified: Secondary | ICD-10-CM | POA: Insufficient documentation

## 2021-10-31 DIAGNOSIS — M25511 Pain in right shoulder: Secondary | ICD-10-CM | POA: Insufficient documentation

## 2021-10-31 DIAGNOSIS — M25531 Pain in right wrist: Secondary | ICD-10-CM | POA: Insufficient documentation

## 2021-10-31 DIAGNOSIS — Z7984 Long term (current) use of oral hypoglycemic drugs: Secondary | ICD-10-CM | POA: Insufficient documentation

## 2021-10-31 DIAGNOSIS — M542 Cervicalgia: Secondary | ICD-10-CM | POA: Insufficient documentation

## 2021-10-31 DIAGNOSIS — R531 Weakness: Secondary | ICD-10-CM | POA: Insufficient documentation

## 2021-10-31 DIAGNOSIS — Y9222 Religious institution as the place of occurrence of the external cause: Secondary | ICD-10-CM | POA: Insufficient documentation

## 2021-10-31 DIAGNOSIS — M545 Low back pain, unspecified: Secondary | ICD-10-CM | POA: Insufficient documentation

## 2021-10-31 DIAGNOSIS — Z794 Long term (current) use of insulin: Secondary | ICD-10-CM | POA: Insufficient documentation

## 2021-10-31 DIAGNOSIS — R42 Dizziness and giddiness: Secondary | ICD-10-CM | POA: Insufficient documentation

## 2021-10-31 DIAGNOSIS — M546 Pain in thoracic spine: Secondary | ICD-10-CM | POA: Insufficient documentation

## 2021-10-31 DIAGNOSIS — W19XXXA Unspecified fall, initial encounter: Secondary | ICD-10-CM

## 2021-10-31 LAB — URINALYSIS, ROUTINE W REFLEX MICROSCOPIC
Bilirubin Urine: NEGATIVE
Glucose, UA: >=500 mg/dL — AB
Hgb urine dipstick: NEGATIVE
Ketones, ur: 20 mg/dL — AB
Leukocytes,Ua: NEGATIVE
Nitrite: NEGATIVE
Protein, ur: NEGATIVE mg/dL
Specific Gravity, Urine: 1.04 — ABNORMAL HIGH (ref 1.005–1.030)
pH: 5 (ref 5.0–8.0)

## 2021-10-31 LAB — CBC WITH DIFFERENTIAL/PLATELET
Abs Immature Granulocytes: 0.01 10*3/uL (ref 0.00–0.07)
Basophils Absolute: 0 10*3/uL (ref 0.0–0.1)
Basophils Relative: 0 %
Eosinophils Absolute: 0 10*3/uL (ref 0.0–0.5)
Eosinophils Relative: 1 %
HCT: 43.1 % (ref 36.0–46.0)
Hemoglobin: 14.8 g/dL (ref 12.0–15.0)
Immature Granulocytes: 0 %
Lymphocytes Relative: 31 %
Lymphs Abs: 1.8 10*3/uL (ref 0.7–4.0)
MCH: 31.9 pg (ref 26.0–34.0)
MCHC: 34.3 g/dL (ref 30.0–36.0)
MCV: 92.9 fL (ref 80.0–100.0)
Monocytes Absolute: 0.4 10*3/uL (ref 0.1–1.0)
Monocytes Relative: 7 %
Neutro Abs: 3.5 10*3/uL (ref 1.7–7.7)
Neutrophils Relative %: 61 %
Platelets: 286 10*3/uL (ref 150–400)
RBC: 4.64 MIL/uL (ref 3.87–5.11)
RDW: 12 % (ref 11.5–15.5)
WBC: 5.8 10*3/uL (ref 4.0–10.5)
nRBC: 0 % (ref 0.0–0.2)

## 2021-10-31 LAB — BASIC METABOLIC PANEL
Anion gap: 9 (ref 5–15)
BUN: 16 mg/dL (ref 6–20)
CO2: 23 mmol/L (ref 22–32)
Calcium: 8.8 mg/dL — ABNORMAL LOW (ref 8.9–10.3)
Chloride: 101 mmol/L (ref 98–111)
Creatinine, Ser: 0.48 mg/dL (ref 0.44–1.00)
GFR, Estimated: >60 mL/min (ref >60)
Glucose, Bld: 307 mg/dL — ABNORMAL HIGH (ref 70–99)
Potassium: 3.8 mmol/L (ref 3.5–5.1)
Sodium: 133 mmol/L — ABNORMAL LOW (ref 135–145)

## 2021-10-31 LAB — TROPONIN I (HIGH SENSITIVITY): Troponin I (High Sensitivity): 6 ng/L (ref <18)

## 2021-10-31 MED ORDER — MECLIZINE HCL 25 MG PO TABS: 25.0000 mg | ORAL_TABLET | Freq: Three times a day (TID) | ORAL | 0 refills | Status: DC | PRN

## 2021-10-31 MED ORDER — OXYCODONE-ACETAMINOPHEN 5-325 MG PO TABS
1.0000 | ORAL_TABLET | Freq: Once | ORAL | Status: AC
  Administered 2021-10-31: 1 via ORAL
  Filled 2021-10-31: qty 1

## 2021-10-31 MED ORDER — MORPHINE SULFATE (PF) 2 MG/ML IV SOLN: 2.0000 mg | Freq: Once | INTRAVENOUS | Status: DC

## 2021-10-31 NOTE — ED Provider Notes (Signed)
Emergency Medicine Provider Triage Evaluation Note  Faith Alvarez , a 50 y.o. female  was evaluated in triage.  Pt complains of fall onset 3 days ago.  Patient was injured when she slipped and fell onto her right arm.  She has associated dizziness x yesterday, right shoulder pain, and right wrist pain.  Patient has tried over-the-counter medications for his symptoms.  Patient denies hitting her head, LOC, or lightheadedness.  She has a history of diabetes.  Review of Systems  Positive: Right shoulder pain, right wrist pain Negative: Hitting her head, LOC  Physical Exam  BP (!) 143/86 (BP Location: Left Arm)   Pulse 81   Temp 98.1 F (36.7 C)   Resp 15   SpO2 100%  Gen:   Awake, no distress   Resp:  Normal effort  MSK:   Moves extremities without difficulty.  Full active range of motion of shoulder and wrist.  No obvious deformity, crepitus.  Medical Decision Making  Medically screening exam initiated at 11:22 AM.  Appropriate orders placed.  Faith Alvarez was informed that the remainder of the evaluation will be completed by another provider, this initial triage assessment does not replace that evaluation, and the importance of remaining in the ED until their evaluation is complete.    Faith Alvarez A, PA-C 10/31/21 1123    Faith Bale, MD 10/31/21 1723

## 2021-10-31 NOTE — ED Triage Notes (Signed)
Patient complains of right arm pain after a fall on Thursday and dizziness that started yesterday. Patient states she was not dizzy when she fell Thursday. Patient also reports history of diabetes.

## 2021-10-31 NOTE — Discharge Instructions (Addendum)
Please use Tylenol or ibuprofen for pain.  You may use 600 mg ibuprofen every 6 hours or 1000 mg of Tylenol every 6 hours.  You may choose to alternate between the 2.  This would be most effective.  Not to exceed 4 g of Tylenol within 24 hours.  Not to exceed 3200 mg ibuprofen 24 hours.  You can use the meclizine, or Antivert as needed for dizziness in the morning.  I encourage you to rest, drink plenty of fluids.  Please follow-up with your primary care doctor at your earliest convenience for your elevated blood sugar, and to get your diabetes under better control.  Please return to the emergency department if you have worsening of your symptoms or your symptoms do not improve despite treatment.

## 2021-10-31 NOTE — ED Notes (Signed)
Patient Alert and oriented to baseline. Stable and ambulatory to baseline. Patient verbalized understanding of the discharge instructions.  Patient belongings were taken by the patient.   

## 2021-10-31 NOTE — ED Provider Notes (Signed)
[facility] EMERGENCY DEPARTMENT Provider Note   CSN: [id number] Arrival date & time: 10/31/21  1042     History Chief Complaint  Patient presents with   Faith Alvarez is a 50 y.o. female past medical history significant for diabetes, diabetic neuropathy, who presents with weakness, dizziness that began yesterday, was worsening this morning.  Patient reports that all of her symptoms began after having a mechanical fall in church on Thursday.  Patient reports that she tripped over her feet, fell hard onto her right side, and did lose consciousness.  Patient denies hitting her head.  Patient has had pain in her right arm, right wrist, and throughout her back since the fall.  Patient woke up yesterday feeling dizzy, and weak.  Patient does endorse having some chest pain prior to her fall.  Patient denies shortness of breath.  Patient has also had some chest pain today, woke up feeling extremely dizzy and weak again, could not push herself up from bed.  Patient denies radiation of the chest pain to the arms or neck.  Denies nausea, vomiting, diarrhea, cough, sore throat.  Patient denies dysuria, hematuria.  Patient denies abdominal pain.  Patient denies any confusion, facial droop.  Fall Associated symptoms include chest pain.      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   OB History   No obstetric history on file.     Family History  Problem Relation Age of Onset   Diabetes Mother    Diabetes Sister     Social History   Tobacco Use   Smoking status: Never   Smokeless tobacco: Never  Substance Use Topics   Alcohol use: Never   Drug use: Never    Home Medications Prior to Admission medications   Medication Sig Start Date End Date Taking? Authorizing Provider  meclizine (ANTIVERT) 25 MG tablet Take 1 tablet (25 mg total) by mouth 3 (three) times daily as needed for dizziness. 10/31/21  Yes Breeanne Oblinger H, PA-C  Blood Glucose Monitoring Suppl (TRUE METRIX METER) w/Device KIT Use as directed 06/28/20   Camillia Herter, NP  FLUoxetine (PROZAC) 10 MG tablet Take 1 tablet (10 mg total) by mouth daily. 06/28/20   Camillia Herter, NP  gabapentin (NEURONTIN) 100 MG capsule Take 1 capsule (100 mg total) by mouth at bedtime. 06/28/20 09/26/20  Camillia Herter, NP  glimepiride (AMARYL) 4 MG tablet Take 1 tablet (4 mg total) by mouth in the morning and at bedtime. Or before the first meal of the day 06/28/20   Camillia Herter, NP  glucose blood (TRUE METRIX BLOOD GLUCOSE TEST) test strip Use as instructed 06/28/20   Camillia Herter, NP  Insulin Pen Needle (B-D UF III MINI PEN NEEDLES) 31G X  5 MM MISC Use as instructed. Inject into the skin once nightly. Patient not taking: Reported on 04/06/2020 07/28/19   Gildardo Pounds, NP  metFORMIN (GLUCOPHAGE) 1000 MG tablet Take 1 tablet (1,000 mg total) by mouth 2 (two) times daily with a meal. 06/28/20   Camillia Herter, NP  omega-3 acid ethyl esters (LOVAZA) 1 g capsule Take 2 capsules (2 g total) by mouth 2 (two) times daily. 08/04/19 09/03/19  Gildardo Pounds, NP  omeprazole (PRILOSEC) 20 MG capsule Take 1 capsule (20 mg total) by mouth daily. To reduce stomach acid Patient not taking: Reported on 04/06/2020 07/28/19    Gildardo Pounds, NP  pravastatin (PRAVACHOL) 20 MG tablet Take 1 tablet (20 mg total) by mouth daily. 06/28/20   Camillia Herter, NP  TRUEplus Lancets 28G MISC Use as directed 06/28/20   Camillia Herter, NP    Allergies    Patient has no known allergies.  Review of Systems   Review of Systems  Cardiovascular:  Positive for chest pain.  Musculoskeletal:  Positive for arthralgias and back pain.  Neurological:  Positive for dizziness and weakness.  All other systems reviewed and are negative.  Physical Exam Updated Vital Signs BP (!) 143/86 (BP Location: Left Arm)   Pulse 81   Temp 98.1 F (36.7 C)   Resp 15   SpO2 100%   Physical Exam Vitals and nursing note reviewed.  Constitutional:      General: She is not in acute distress.    Appearance: Normal appearance.  HENT:     Head: Normocephalic and atraumatic.  Eyes:     General:        Right eye: No discharge.        Left eye: No discharge.  Cardiovascular:     Rate and Rhythm: Normal rate and regular rhythm.     Pulses: Normal pulses.     Heart sounds: No murmur heard.   No friction rub. No gallop.     Comments: Intact radial and ulnar pulses on the right side. Pulmonary:     Effort: Pulmonary effort is normal.     Breath sounds: Normal breath sounds.  Abdominal:     General: Bowel sounds are normal.     Palpations: Abdomen is soft.  Musculoskeletal:     Cervical back: Neck supple. Tenderness present.     Comments: Midline spinal tenderness in cervical spine, thoracic spine without step-off or deformity.  No tenderness of the lumbar spine.  Tenderness to palpation of the right shoulder, right wrist.  No step-off or deformity noted.  Intact strength 5 out of 5 despite pain.  Skin:    General: Skin is warm and dry.     Capillary Refill: Capillary refill takes less than 2 seconds.  Neurological:     Mental Status: She is alert and oriented to person, place, and time.     Comments: CN III through XII grossly intact.   Intact strength 5 out of 5 bilateral lower and upper extremities.  Intact finger-nose.  No pronator drift.  No dysdiadochokinesia.  Alert and oriented x4.  Psychiatric:        Mood and Affect: Mood normal.        Behavior: Behavior normal.    ED Results / Procedures / Treatments   Labs (all labs ordered are listed, but only abnormal results are displayed) Labs Reviewed  BASIC METABOLIC PANEL - Abnormal; Notable for the following components:  Result Value   Sodium 133 (*)    Glucose, Bld 307 (*)    Calcium 8.8 (*)    All other components within normal limits  URINALYSIS, ROUTINE W REFLEX MICROSCOPIC - Abnormal; Notable for the following components:   APPearance HAZY (*)    Specific Gravity, Urine 1.040 (*)    Glucose, UA >=500 (*)    Ketones, ur 20 (*)    Bacteria, UA RARE (*)    All other components within normal limits  CBC WITH DIFFERENTIAL/PLATELET  TROPONIN I (HIGH SENSITIVITY)    EKG EKG Interpretation  Date/Time:  Monday October 31 2021 11:28:53 EST Ventricular Rate:  82 PR Interval:  114 QRS Duration: 76 QT Interval:  318 QTC Calculation: 371 R Axis:   87 Text Interpretation: Normal sinus rhythm Nonspecific T wave abnormality Abnormal ECG No significant change since last tracing Confirmed by Isla Pence [phone]) on 10/31/2021 12:31:16 PM  Radiology DG Chest 2 View  Result Date: 10/31/2021 CLINICAL DATA:  Chest pain, fall EXAM: CHEST - 2 VIEW COMPARISON:  03/31/2017 FINDINGS: The heart size and mediastinal contours are within normal limits. Both lungs are clear. The visualized skeletal structures are unremarkable. IMPRESSION: No active cardiopulmonary disease. Electronically Signed   By: Elmer Picker M.D.   On: 10/31/2021 12:56   DG Shoulder Right  Result Date: 10/31/2021 CLINICAL DATA:  Trauma, fall EXAM: RIGHT SHOULDER - 2+ VIEW COMPARISON:  None. FINDINGS: There is no evidence of fracture or dislocation. There is no evidence of arthropathy or  other focal bone abnormality. Soft tissues are unremarkable. IMPRESSION: No fracture or dislocation is seen. Electronically Signed   By: Elmer Picker M.D.   On: 10/31/2021 12:55   DG Wrist Complete Right  Result Date: 10/31/2021 CLINICAL DATA:  Trauma, fall EXAM: RIGHT WRIST - COMPLETE 3+ VIEW COMPARISON:  None. FINDINGS: There is no evidence of fracture or dislocation. There is no evidence of arthropathy or other focal bone abnormality. Soft tissues are unremarkable. IMPRESSION: No recent fracture or dislocation is seen in the right wrist. Electronically Signed   By: Elmer Picker M.D.   On: 10/31/2021 12:54   CT Head Wo Contrast  Result Date: 10/31/2021 CLINICAL DATA:  Dizziness, trauma, fall EXAM: CT HEAD WITHOUT CONTRAST TECHNIQUE: Contiguous axial images were obtained from the base of the skull through the vertex without intravenous contrast. COMPARISON:  03/31/2017 FINDINGS: Brain: No acute intracranial findings are seen. Ventricles are not dilated. There are no signs of bleeding within the cranium. There is no focal mass effect. Vascular: Unremarkable Skull: Unremarkable. Sinuses/Orbits: There is mucosal thickening in the frontal, ethmoid and left maxillary sinuses. There is decrease in number of the air cells in mastoids on both sides. Other: None IMPRESSION: No acute intracranial findings are seen in noncontrast CT brain. Chronic sinusitis.  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 Electronically Signed   By: Elmer Picker M.D.   On: 10/31/2021 13:05   CT Cervical Spine Wo Contrast  Result Date: 10/31/2021 CLINICAL DATA:  Trauma, fall EXAM: CT CERVICAL SPINE WITHOUT CONTRAST CT THORACIC SPINE WITHOUT CONTRAST TECHNIQUE: Multidetector CT imaging of the cervical and cervical spine was performed without intravenous contrast. Multiplanar CT image reconstructions were also generated. COMPARISON:  None. FINDINGS: Alignment: Degenerative and/or positional straightening of the  normal cervical lordosis. Skull base and vertebrae: Intact.  No fracture. Soft tissues and spinal canal: No prevertebral fluid or swelling. No visible canal hematoma. Disc levels: Focally mild disc space height loss and osteophytosis of C5-C6 with otherwise  preserved disc spaces. Partially imaged chest and upper abdomen: Negative. Other: None. IMPRESSION: 1. No fracture or static subluxation of the cervical spine. 2. Focally mild disc space height loss and osteophytosis of C5-C6 with otherwise preserved disc spaces throughout the cervical and thoracic spine. Electronically Signed   By: Delanna Ahmadi M.D.   On: 10/31/2021 13:27   CT Thoracic Spine Wo Contrast  Result Date: 10/31/2021 CLINICAL DATA:  Trauma, fall EXAM: CT CERVICAL SPINE WITHOUT CONTRAST CT THORACIC SPINE WITHOUT CONTRAST TECHNIQUE: Multidetector CT imaging of the cervical and cervical spine was performed without intravenous contrast. Multiplanar CT image reconstructions were also generated. COMPARISON:  None. FINDINGS: Alignment: Degenerative and/or positional straightening of the normal cervical lordosis. Skull base and vertebrae: Intact.  No fracture. Soft tissues and spinal canal: No prevertebral fluid or swelling. No visible canal hematoma. Disc levels: Focally mild disc space height loss and osteophytosis of C5-C6 with otherwise preserved disc spaces. Partially imaged chest and upper abdomen: Negative. Other: None. IMPRESSION: 1. No fracture or static subluxation of the cervical spine. 2. Focally mild disc space height loss and osteophytosis of C5-C6 with otherwise preserved disc spaces throughout the cervical and thoracic spine. Electronically Signed   By: Delanna Ahmadi M.D.   On: 10/31/2021 13:27    Procedures Procedures   Medications Ordered in ED Medications  oxyCODONE-acetaminophen (PERCOCET/ROXICET) 5-325 MG per tablet 1 tablet (1 tablet Oral Given 10/31/21 1334)    ED Course  I have reviewed the triage vital signs and the  nursing notes.  Pertinent labs & imaging results that were available during my care of the patient were reviewed by me and considered in my medical decision making (see chart for details).    MDM Rules/Calculators/A&P                         I discussed this case with my attending physician who cosigned this note including patient's presenting symptoms, physical exam, and planned diagnostics and interventions. Attending physician stated agreement with plan or made changes to plan which were implemented.   Overall well-appearing female with normal neurologic exam.  Patient tells a story of a syncopal fall with transient loss of consciousness.  Patient with no focal neurodeficits in the interim.  Patient with 2 days of some dizziness on waking up that resolves over the course of the day.  Patient with some continued pain and weakness of the right side where she sustained a fall.  Will obtain imaging of the head, and neck where she had some midline tenderness as well as her affected extremities to evaluate for fracture or dislocation.  We will also run some lab work including CBC, BMP, EKG, chest x-ray and troponin as patient endorses some chest pain prior to the fall, as well as today.  Patient reports transient sharp, stabbing chest pain nonexertional in nature.  Patient denies any radiation of chest pain.  Patient with a heart score of 3.  Patient with negative troponin x1 with greater than 3 hours since last episode of chest pain.  At this time no evident cause of patient's dizziness, lightheadedness, however no concern for emergent finding at this time.  No acute abnormality on any of the radiographic imaging.  No acute fractures or dislocations.  Patient with elevated glucose, glucosuria, recommend follow-up with primary care doctor to assess for adequate control of diabetes.  Reassessed patient who reports dizziness is improved at this time.  Patient discharged in stable condition, encouraged  to  follow-up with primary care doctor to further assess diabetes.  Encouraged to rest, drink plenty of fluids.  Will prescribe meclizine for dizziness.  Patient understands and agrees to plan, discharged in stable condition at this time. Final Clinical Impression(s) / ED Diagnoses Final diagnoses:  Fall, initial encounter  Dizziness    Rx / DC Orders ED Discharge Orders          Ordered    meclizine (ANTIVERT) 25 MG tablet  3 times daily PRN        10/31/21 1453             Kerria Sapien, Joesph Fillers, PA-C 10/31/21 1455    Isla Pence, MD 10/31/21 [phone]

## 2022-07-16 IMAGING — CT CT HEAD W/O CM
4 series · 17 of 47 positions shown, 19 images · non-contrast
Comparison: 03/31/2017

CLINICAL DATA: Dizziness, trauma, fall

EXAM:
CT HEAD WITHOUT CONTRAST
TECHNIQUE: Contiguous axial images were obtained from the base of the skull
through the vertex without intravenous contrast.

[Series 3: head without · axial · non-contrast · 0.41mm/px · z∈[-28,+92]mm · 7 of 33 slices shown, 9 images]
[im 5/33  brain]
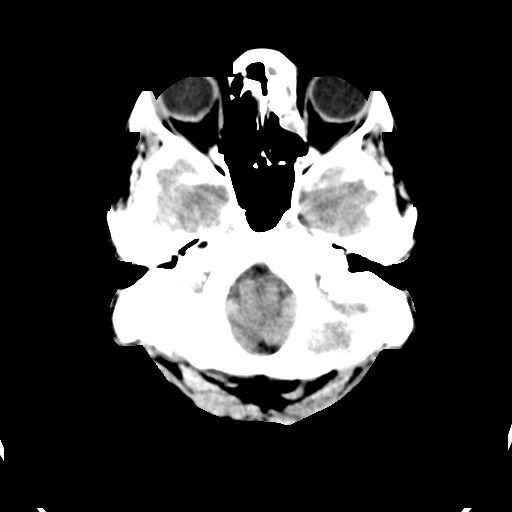
[im 5/33  bone]
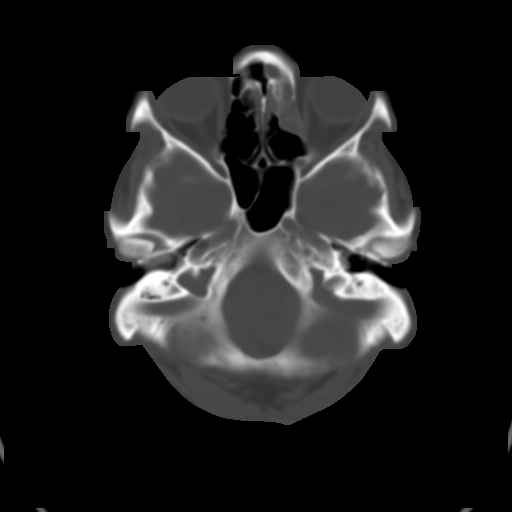
[im 9/33  brain]
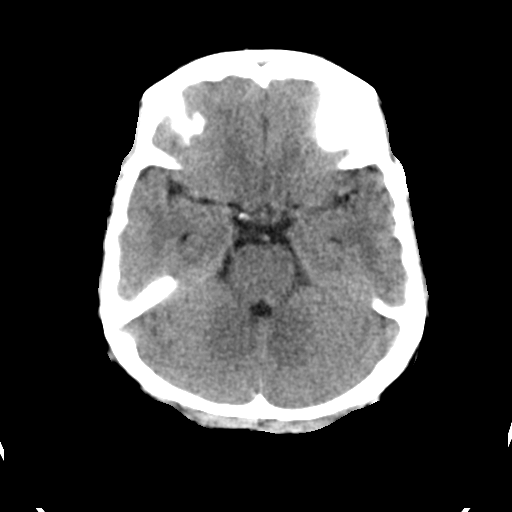
[im 13/33  brain]
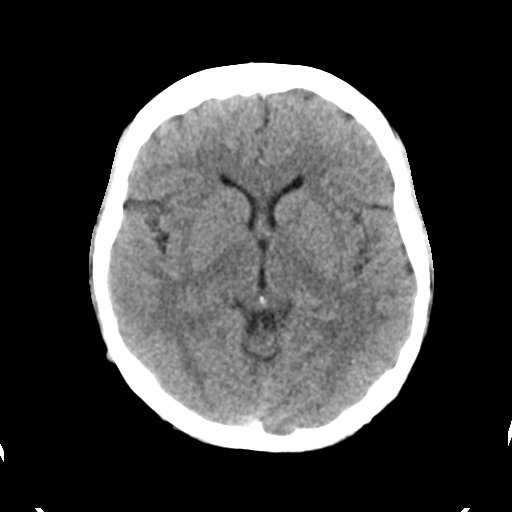
[im 17/33  brain]
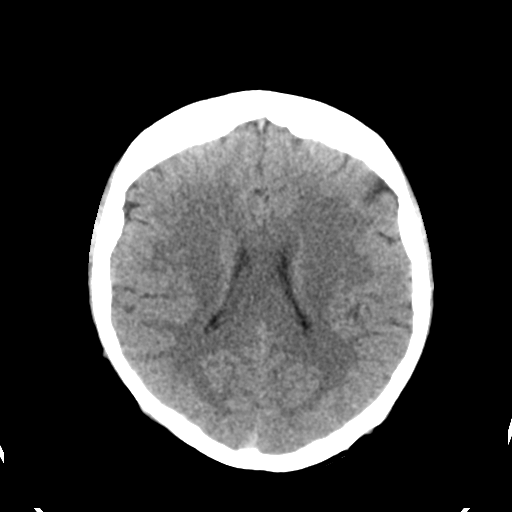
[im 21/33  brain]
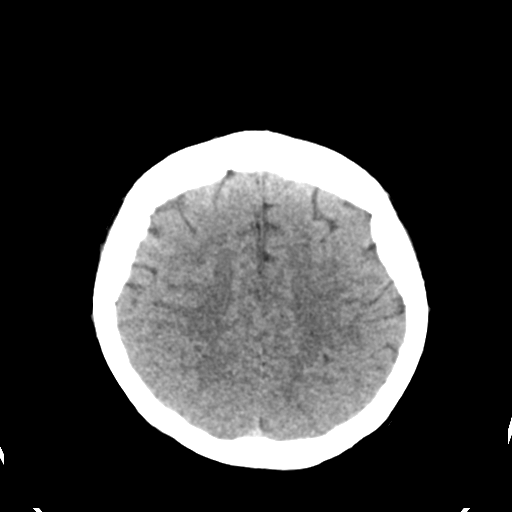
[im 21/33  bone]
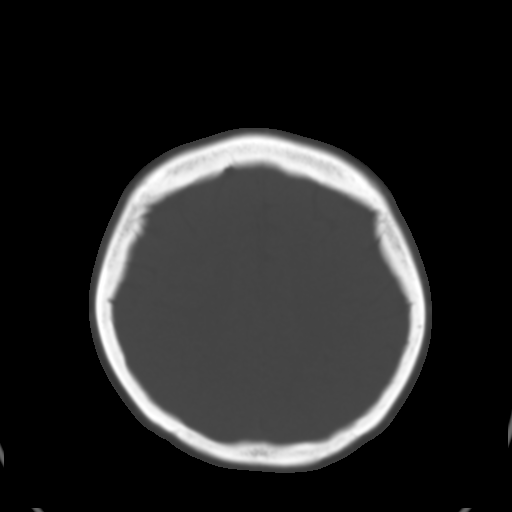
[im 25/33  brain]
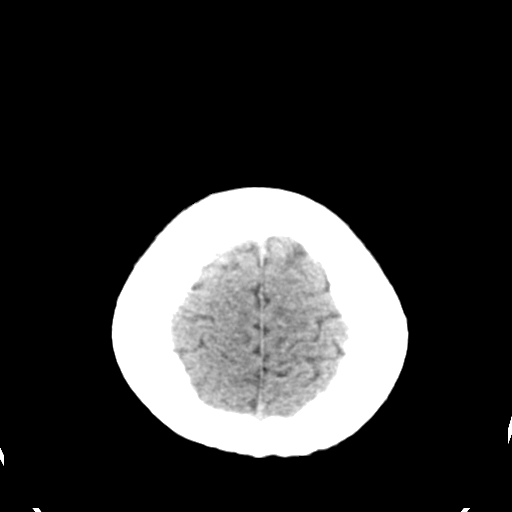
[im 29/33  brain]
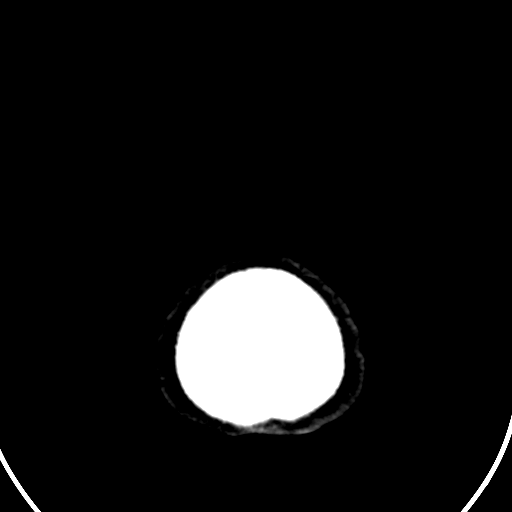

[Series 4: head bone · axial · 0.41mm/px · z∈[-32,+24]mm · 4 of 81 slices shown]
[im 9/81  bone]
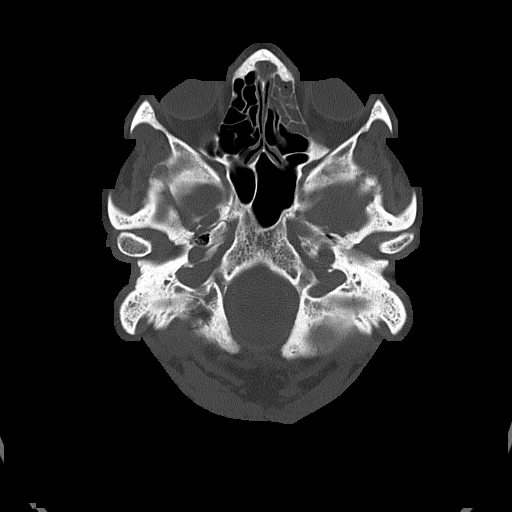
[im 17/81  bone]
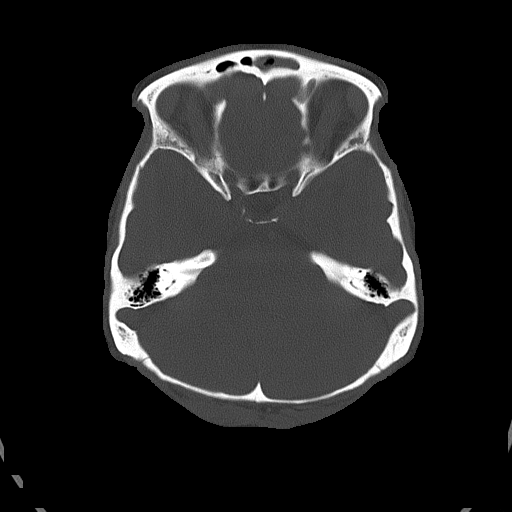
[im 25/81  bone]
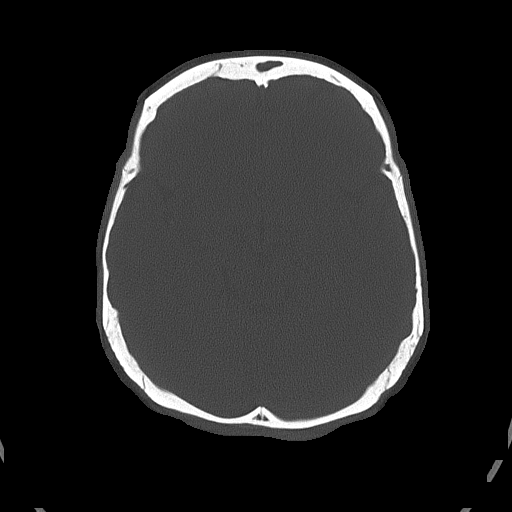
[im 37/81  bone]
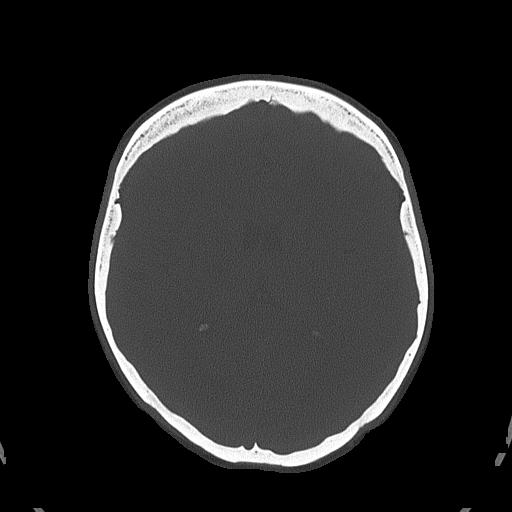

[Series 5: head without cor · coronal · non-contrast · 0.30mm/px · 3 of 63 slices shown]
[im 21/63  brain]
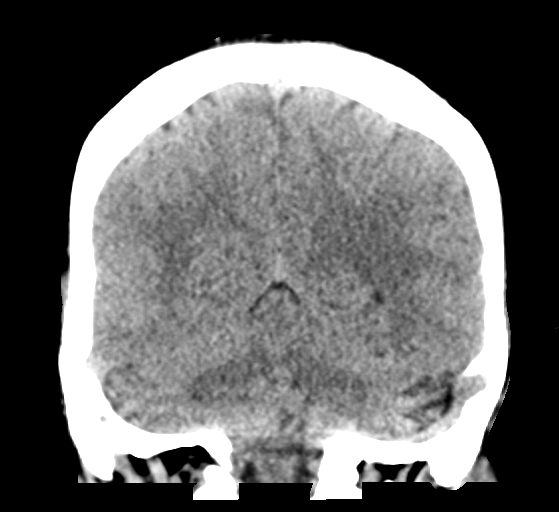
[im 28/63  brain]
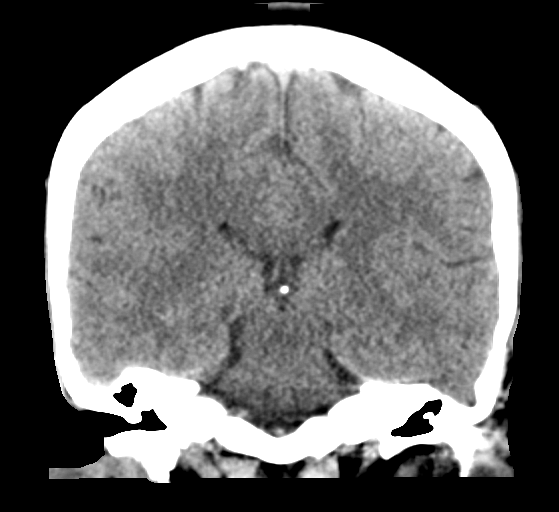
[im 35/63  brain]
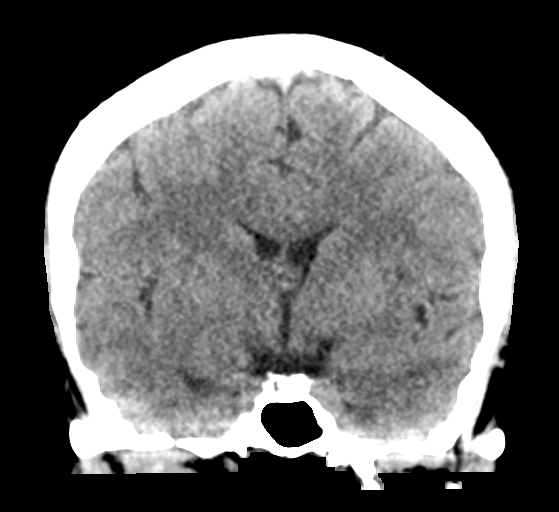

[Series 6: head without sag · sagittal · non-contrast · 0.31mm/px · 3 of 54 slices shown]
[im 18/54  brain]
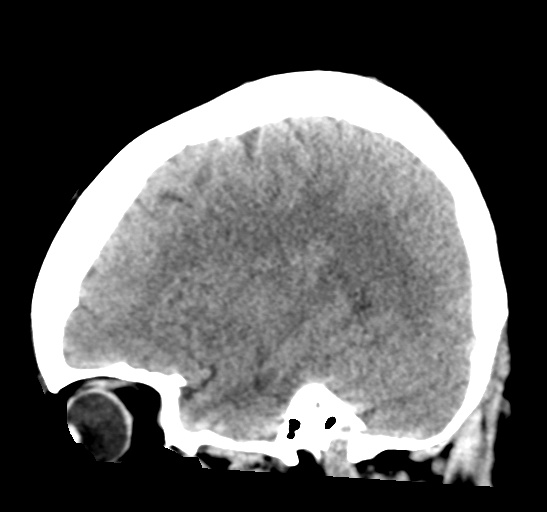
[im 27/54  brain]
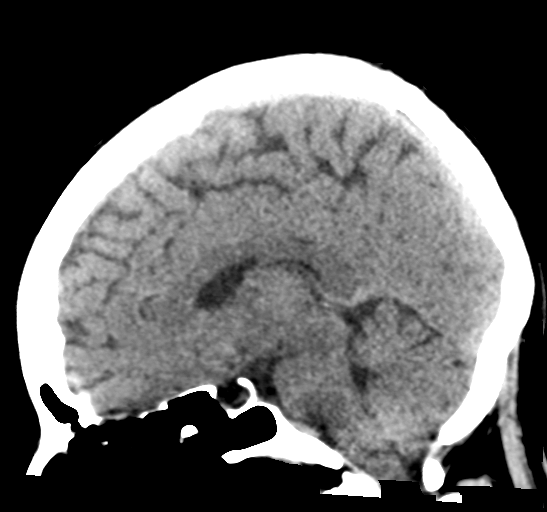
[im 36/54  brain]
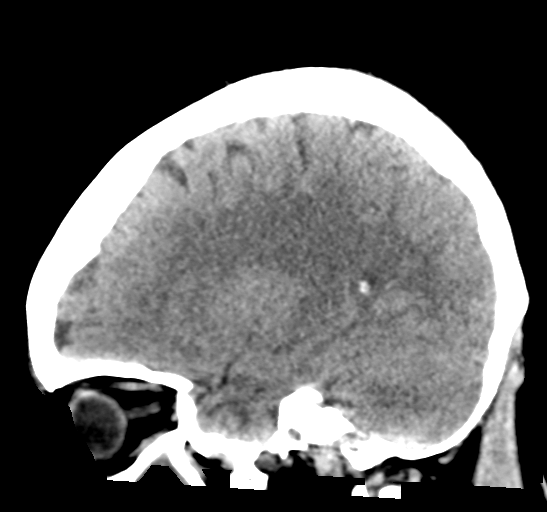

[17 of 47 positions shown; findings below may reference images not displayed]

FINDINGS: Brain: No acute intracranial findings are seen. Ventricles are not
dilated. There are no signs of bleeding within the cranium. There is
no focal mass effect.

Vascular: Unremarkable

Skull: Unremarkable.

Sinuses/Orbits: There is mucosal thickening in the frontal, ethmoid
and left maxillary sinuses. There is decrease in number of the air
cells in mastoids on both sides.

Other: None
IMPRESSION: No acute intracranial findings are seen in noncontrast CT brain.

Chronic sinusitis.  Possible chronic bilateral mastoiditis.

## 2024-06-28 ENCOUNTER — Telehealth: Payer: Self-pay

## 2024-06-28 ENCOUNTER — Ambulatory Visit
Admission: EM | Admit: 2024-06-28 | Discharge: 2024-06-28 | Disposition: A | Discharge status: Home / Self Care | Payer: Self-pay

## 2024-06-28 DIAGNOSIS — N644 Mastodynia: Secondary | ICD-10-CM

## 2024-06-28 MED ORDER — DICLOFENAC SODIUM 50 MG PO TBEC: 50.0000 mg | DELAYED_RELEASE_TABLET | Freq: Two times a day (BID) | ORAL | 1 refills | Status: DC

## 2024-06-28 NOTE — ED Provider Notes (Signed)
 [facility]  Note:  This document was prepared using Conservation officer, historic buildings and may include unintentional dictation errors.  MRN: [id number] DOB: Jul 19, 1971  Subjective:   Faith Alvarez is a 53 y.o. female presenting for new onset right breast stinging/pain that started today while at work.  Patient reports no known injury, no insect bite or sting.  Patient has no redness or erythema or swelling to the area, no mass.  Patient states that stinging feeling feels deep down in the tissue.  Denies any past history of similar symptoms.  Patient has not taken any over-the-counter medication to treat symptoms.  No current facility-administered medications for this encounter.  Current Outpatient Medications:    benzonatate (TESSALON) 200 MG capsule, Take 200 mg by mouth 3 (three) times daily as needed., Disp: , Rfl:    diclofenac  (VOLTAREN ) 50 MG EC tablet, Take 1 tablet (50 mg total) by mouth 2 (two) times daily., Disp: 30 tablet, Rfl: 1   fluconazole  (DIFLUCAN ) 150 MG tablet, Take 150 mg by mouth once., Disp: , Rfl:    glipiZIDE  (GLUCOTROL ) 10 MG tablet, Take 10 mg by mouth 2 (two) times daily before a meal., Disp: , Rfl:    ketoconazole (NIZORAL) 2 % cream, Apply 1 Application topically daily., Disp: , Rfl:    lisinopril (ZESTRIL) 2.5 MG tablet, Take 2.5 mg by mouth daily., Disp: , Rfl:    loratadine (CLARITIN) 10 MG tablet, Take 10 mg by mouth daily as needed., Disp: , Rfl:    lovastatin (MEVACOR) 40 MG tablet, Take 40 mg by mouth at bedtime., Disp: , Rfl:    Omega-3 Fatty Acids (FISH OIL CONCENTRATE PO), Take 1,000 mg by mouth daily., Disp: , Rfl:    PARoxetine (PAXIL) 20 MG tablet, Take 20 mg by mouth daily., Disp: , Rfl:    blood glucose meter kit and supplies, , Disp: , Rfl:    Blood Glucose Monitoring Suppl (TRUE METRIX METER) w/Device KIT, Use as directed, Disp: 1 kit, Rfl: 0   FLUoxetine  (PROZAC ) 10 MG tablet, Take 1 tablet (10 mg total) by mouth daily.,  Disp: 30 tablet, Rfl: 1   gabapentin  (NEURONTIN ) 100 MG capsule, Take 1 capsule (100 mg total) by mouth at bedtime., Disp: 90 capsule, Rfl: 0   glimepiride  (AMARYL ) 4 MG tablet, Take 1 tablet (4 mg total) by mouth in the morning and at bedtime. Or before the first meal of the day, Disp: 60 tablet, Rfl: 2   glucose blood (TRUE METRIX BLOOD GLUCOSE TEST) test strip, Use as instructed, Disp: 100 each, Rfl: 12   Insulin  Pen Needle (Alvarez-D UF III MINI PEN NEEDLES) 31G X 5 MM MISC, Use as instructed. Inject into the skin once nightly. (Patient not taking: Reported on 04/06/2020), Disp: 100 each, Rfl: 1   meclizine  (ANTIVERT ) 25 MG tablet, Take 1 tablet (25 mg total) by mouth 3 (three) times daily as needed for dizziness., Disp: 30 tablet, Rfl: 0   metFORMIN  (GLUCOPHAGE ) 1000 MG tablet, Take 1 tablet (1,000 mg total) by mouth 2 (two) times daily with a meal., Disp: 60 tablet, Rfl: 2   omega-3 acid ethyl esters (LOVAZA ) 1 g capsule, Take 2 capsules (2 g total) by mouth 2 (two) times daily., Disp: 120 capsule, Rfl: 2   omeprazole  (PRILOSEC) 20 MG capsule, Take 1 capsule (20 mg total) by mouth daily. To reduce stomach acid (Patient not taking: Reported on 04/06/2020), Disp: 90 capsule, Rfl: 1   pravastatin  (PRAVACHOL ) 20 MG tablet, Take 1  tablet (20 mg total) by mouth daily., Disp: 90 tablet, Rfl: 0   TRUEplus Lancets 28G MISC, Use as directed, Disp: 100 each, Rfl: 12   No Known Allergies  Past Medical History:  Diagnosis Date   Anxiety    Depression    Diabetes mellitus without complication ([facility])    Diabetic neuropathy ([facility])    GERD (gastroesophageal reflux disease)    Hyperlipidemia      History reviewed. No pertinent surgical history.  Family History  Problem Relation Age of Onset   Diabetes Mother    Diabetes Sister     Social History   Tobacco Use   Smoking status: Never    Passive exposure: Never   Smokeless tobacco: Never  Vaping Use   Vaping status: Never Used  Substance Use Topics    Alcohol use: Never   Drug use: Never    ROS Refer to HPI for ROS details.  Objective:   Vitals: BP (!) 143/84 (BP Location: Left Arm)   Pulse 77   Temp 98.2 F (36.8 C) (Oral)   Resp 18   Ht 5' 2 (1.575 m)   Wt 124 lb 9 oz (56.5 kg)   LMP 02/09/2024 (Within Months)   SpO2 98%   BMI 22.78 kg/m   Physical Exam Vitals and nursing note reviewed.  Constitutional:      General: She is not in acute distress.    Appearance: Normal appearance. She is not ill-appearing.  HENT:     Head: Normocephalic.  Cardiovascular:     Rate and Rhythm: Normal rate.  Pulmonary:     Effort: Pulmonary effort is normal. No respiratory distress.  Chest:     Chest wall: No mass, deformity, swelling, tenderness or edema.  Breasts:    Right: No swelling, bleeding, inverted nipple, mass, nipple discharge, skin change or tenderness.     Left: Normal.  Skin:    General: Skin is warm and dry.     Capillary Refill: Capillary refill takes less than 2 seconds.  Neurological:     General: No focal deficit present.     Mental Status: She is alert and oriented to person, place, and time.  Psychiatric:        Mood and Affect: Mood normal.        Behavior: Behavior normal.     Procedures  No results found for this or any previous visit (from the past 24 hours).  No results found.   Assessment and Plan :     Discharge Instructions       1. Acute breast pain (Primary) - diclofenac  (VOLTAREN ) 50 MG EC tablet; Take 1 tablet (50 mg total) by mouth 2 (two) times daily.  Dispense: 30 tablet; Refill: 1 - Ambulatory referral to Internal Medicine for follow-up evaluation and management of new onset right breast pain - If symptoms worsen or you develop new symptoms follow-up in ER for further evaluation management       Faith KATHEE Aurea Aurea Faith B, NP 06/28/24 1339

## 2024-06-28 NOTE — Discharge Instructions (Addendum)
  1. Acute breast pain (Primary) - diclofenac  (VOLTAREN ) 50 MG EC tablet; Take 1 tablet (50 mg total) by mouth 2 (two) times daily.  Dispense: 30 tablet; Refill: 1 - Ambulatory referral to Internal Medicine for follow-up evaluation and management of new onset right breast pain - If symptoms worsen or you develop new symptoms follow-up in ER for further evaluation management

## 2024-06-28 NOTE — ED Notes (Signed)
 Discussed in length the need for [facility] health/PCP-Internal Medicine visit asap. 1st available in [facility] is 08-2024. Appt completed online (with assistance) to [facility] (per patient).

## 2024-06-28 NOTE — ED Triage Notes (Signed)
 Due to language barrier, an interpreter was present during the history-taking and subsequent discussion (and for part of the physical exam) with this patient. Ignacio. Number: [id number].  Here with Family Member. Suddenly while at work my right breast started hurting as if something was poking me. No injury. No bite. No stinging by insect known. The pain feels deep.

## 2024-06-28 NOTE — Telephone Encounter (Signed)
 A user error has taken place: encounter opened in error, closed for administrative reasons.

## 2024-07-23 ENCOUNTER — Emergency Department (HOSPITAL_COMMUNITY): Payer: Self-pay

## 2024-07-23 ENCOUNTER — Ambulatory Visit: Payer: Self-pay

## 2024-07-23 ENCOUNTER — Other Ambulatory Visit: Payer: Self-pay

## 2024-07-23 ENCOUNTER — Encounter (HOSPITAL_COMMUNITY): Payer: Self-pay

## 2024-07-23 ENCOUNTER — Emergency Department (HOSPITAL_COMMUNITY)
Admission: EM | Admit: 2024-07-23 | Discharge: 2024-07-23 | Disposition: A | Discharge status: Home / Self Care | Payer: Self-pay | Source: Ambulatory Visit | Attending: Emergency Medicine | Admitting: Emergency Medicine

## 2024-07-23 DIAGNOSIS — R0789 Other chest pain: Secondary | ICD-10-CM

## 2024-07-23 DIAGNOSIS — E1165 Type 2 diabetes mellitus with hyperglycemia: Secondary | ICD-10-CM | POA: Insufficient documentation

## 2024-07-23 DIAGNOSIS — F32A Depression, unspecified: Secondary | ICD-10-CM | POA: Insufficient documentation

## 2024-07-23 DIAGNOSIS — Z7984 Long term (current) use of oral hypoglycemic drugs: Secondary | ICD-10-CM | POA: Insufficient documentation

## 2024-07-23 LAB — CBC
HCT: 42.8 % (ref 36.0–46.0)
Hemoglobin: 14.4 g/dL (ref 12.0–15.0)
MCH: 30.6 pg (ref 26.0–34.0)
MCHC: 33.6 g/dL (ref 30.0–36.0)
MCV: 90.9 fL (ref 80.0–100.0)
Platelets: 288 K/uL (ref 150–400)
RBC: 4.71 MIL/uL (ref 3.87–5.11)
RDW: 11.5 % (ref 11.5–15.5)
WBC: 6.7 K/uL (ref 4.0–10.5)
nRBC: 0 % (ref 0.0–0.2)

## 2024-07-23 LAB — COMPREHENSIVE METABOLIC PANEL WITH GFR
ALT: 17 U/L (ref 0–44)
AST: 18 U/L (ref 15–41)
Albumin: 4.1 g/dL (ref 3.5–5.0)
Alkaline Phosphatase: 109 U/L (ref 38–126)
Anion gap: 10 (ref 5–15)
BUN: 19 mg/dL (ref 6–20)
CO2: 24 mmol/L (ref 22–32)
Calcium: 9.3 mg/dL (ref 8.9–10.3)
Chloride: 97 mmol/L — ABNORMAL LOW (ref 98–111)
Creatinine, Ser: 0.53 mg/dL (ref 0.44–1.00)
GFR, Estimated: >60 mL/min (ref >60)
Glucose, Bld: 282 mg/dL — ABNORMAL HIGH (ref 70–99)
Potassium: 3.8 mmol/L (ref 3.5–5.1)
Sodium: 131 mmol/L — ABNORMAL LOW (ref 135–145)
Total Bilirubin: 0.7 mg/dL (ref 0.0–1.2)
Total Protein: 8.1 g/dL (ref 6.5–8.1)

## 2024-07-23 LAB — CBG MONITORING, ED: Glucose-Capillary: 280 mg/dL — ABNORMAL HIGH (ref 70–99)

## 2024-07-23 LAB — ETHANOL: Alcohol, Ethyl (B): <15 mg/dL (ref <15)

## 2024-07-23 LAB — TROPONIN I (HIGH SENSITIVITY): Troponin I (High Sensitivity): <2 ng/L (ref <18)

## 2024-07-23 MED ORDER — LANCET DEVICE MISC
1.0000 | Freq: Three times a day (TID) | 0 refills | Status: AC
Start: 2024-07-23 — End: 2024-08-22
  Filled 2024-07-23 (×2): qty 1, 30d supply, fill #0

## 2024-07-23 MED ORDER — METFORMIN HCL 1000 MG PO TABS
1000.0000 mg | ORAL_TABLET | Freq: Two times a day (BID) | ORAL | 0 refills | Status: AC
  Filled 2024-07-23 (×2): qty 120, 60d supply, fill #0

## 2024-07-23 MED ORDER — BLOOD GLUCOSE MONITOR SYSTEM W/DEVICE KIT
1.0000 | PACK | Freq: Three times a day (TID) | 0 refills | Status: AC
  Filled 2024-07-23: qty 1, 30d supply, fill #0
  Filled 2024-07-23: qty 1, fill #0

## 2024-07-23 MED ORDER — ACCU-CHEK SOFTCLIX LANCETS MISC
1.0000 | Freq: Three times a day (TID) | 0 refills | Status: AC
  Filled 2024-07-23 (×2): qty 100, 30d supply, fill #0

## 2024-07-23 MED ORDER — BLOOD GLUCOSE TEST VI STRP
1.0000 | ORAL_STRIP | Freq: Three times a day (TID) | 0 refills | Status: AC
  Filled 2024-07-23 (×2): qty 100, 34d supply, fill #0

## 2024-07-23 NOTE — Telephone Encounter (Signed)
 FYI Only or Action Required?: FYI only for provider.  Patient was last seen in primary care on n/a.  Called Nurse Triage reporting Chest Pain.  Symptoms began x 2 weeks.  Interventions attempted: Nothing.  Symptoms are: unchanged.  Triage Disposition: Go to ED Now (or PCP Triage), See Physician Within 24 Hours  Patient/caregiver understands and will follow disposition?: Yes    Copied from CRM #[id number]. Topic: Clinical - Red Word Triage >> Jul 23, 2024  4:35 PM Wess RAMAN wrote: Red Word that prompted transfer to Nurse Triage: Chest pain and would like to be seen tomorrow for an appt. Chest pain started in her lower breast and went upwards towards her chest now for 2 weeks. Currently has no PCP. Patient states it hurts when she moves. Pain level ranges from 6-8.  Interpreter Bernette [phone] Reason for Disposition  [1] Chest pain lasts > 5 minutes AND [2] occurred > 3 days ago (72 hours) AND [3] NO chest pain or cardiac symptoms now  [1] Chest pain lasts > 5 minutes AND [2] occurred in past 3 days (72 hours) (Exception: Feels exactly the same as previously diagnosed heartburn and has accompanying sour taste in mouth.)  Answer Assessment - Initial Assessment Questions 1. LOCATION: Where does it hurt?   Right and left chest pain - pain is settled on chest- strong pain f& sometimes eels needles 2. RADIATION: Does the pain go anywhere else? (e.g., into neck, jaw, arms, back)     Back  3. ONSET: When did the chest pain begin? (Minutes, hours or days)      Ongoing x 2 weeks 4. PATTERN: Does the pain come and go, or has it been constant since it started?  Does it get worse with exertion?      Comes and goes 5. DURATION: How long does it last (e.g., seconds, minutes, hours)     na 6. SEVERITY: How bad is the pain?  (e.g., Scale 1-10; mild, moderate, or severe)     6 to 8/10  7. CARDIAC RISK FACTORS: Do you have any history of heart problems or risk factors for heart  disease? (e.g., angina, prior heart attack; diabetes, high blood pressure, high cholesterol, smoker, or strong family history of heart disease)     na 8. PULMONARY RISK FACTORS: Do you have any history of lung disease?  (e.g., blood clots in lung, asthma, emphysema, birth control pills)     na 9. CAUSE: What do you think is causing the chest pain?     unknown 10. OTHER SYMPTOMS: Do you have any other symptoms? (e.g., dizziness, nausea, vomiting, sweating, fever, difficulty breathing, cough)       nasuea 11. PREGNANCY: Is there any chance you are pregnant? When was your last menstrual period?       Na   Nurse referred patient to Ed for further evaluation and tx.  Protocols used: Chest Pain-A-AH

## 2024-07-23 NOTE — ED Triage Notes (Signed)
 Pt presents to ED from home C/O chest pain X 3 weeks. Pt also requesting to talk with psych provider d/t SI. Pt is tearful in triage. Endorses SI, no plan. PA in triage made aware.

## 2024-07-23 NOTE — ED Provider Notes (Signed)
 [facility] AT [facility] Provider Note   CSN: [id number] Arrival date & time: 07/23/24  1752     Patient presents with: Chest Pain and Psychiatric Evaluation   Faith Alvarez is a 53 y.o. female.   Patient complains of chest pain for the past 3 weeks.  Patient was seen at urgent care and referred for evaluation.  Patient's daughter states they were unable to make the appointment for evaluation.  Patient has a past medical history of diabetes she is not currently taking her medications.  Patient's daughter reports that patient has recently been depressed.  Patient has increased sadness because tomorrow is the anniversary of her father's death.  Patient is concerned because she has had pain in her chest for the past 3 weeks.  She reports that the pain comes and goes.  Patient states sometimes when she cries the pain feels better.  Patient reports she has had thoughts that she would be better off if she did not wake up.  Patient does not have any suicidal plans.  Patient denies any homicidal thoughts.  Patient's daughter is the interpreter.  The history is provided by the patient and a relative.  Chest Pain Pain location:  L chest and R chest Pain quality: aching   Pain radiates to:  Does not radiate Timing:  Constant Progression:  Worsening Context: not breathing and not eating   Relieved by:  Nothing Worsened by:  Nothing Ineffective treatments:  None tried Associated symptoms: no abdominal pain   Risk factors: diabetes mellitus        Prior to Admission medications   Medication Sig Start Date End Date Taking? Authorizing Provider  Blood Glucose Monitoring Suppl DEVI 1 each by Does not apply route in the morning, at noon, and at bedtime. May substitute to any manufacturer covered by patient's insurance. 07/23/24  Yes Savera Donson K, PA-C  Glucose Blood (BLOOD GLUCOSE TEST STRIPS) STRP 1 each by In Vitro route in the morning, at noon,  and at bedtime. May substitute to any manufacturer covered by patient's insurance. 07/23/24 08/22/24 Yes Flint Sonny POUR, PA-C  Lancet Device MISC 1 each by Does not apply route in the morning, at noon, and at bedtime. May substitute to any manufacturer covered by patient's insurance. 07/23/24 08/22/24 Yes Flint Sonny POUR, PA-C  Lancets Misc. MISC 1 each by Does not apply route in the morning, at noon, and at bedtime. May substitute to any manufacturer covered by patient's insurance. 07/23/24 08/22/24 Yes Gwynn Crossley K, PA-C  metFORMIN  (GLUCOPHAGE ) 1000 MG tablet Take 1 tablet (1,000 mg total) by mouth 2 (two) times daily with a meal. 07/23/24  Yes Flint Sonny K, PA-C  benzonatate (TESSALON) 200 MG capsule Take 200 mg by mouth 3 (three) times daily as needed. 04/09/17   [provider]  blood glucose meter kit and supplies     [provider]  diclofenac  (VOLTAREN ) 50 MG EC tablet Take 1 tablet (50 mg total) by mouth 2 (two) times daily. 06/28/24   Reddick, Johnathan B, NP  fluconazole  (DIFLUCAN ) 150 MG tablet Take 150 mg by mouth once. 02/12/17   [provider]  FLUoxetine  (PROZAC ) 10 MG tablet Take 1 tablet (10 mg total) by mouth daily. 06/28/20   Lorren Greig PARAS, NP  gabapentin  (NEURONTIN ) 100 MG capsule Take 1 capsule (100 mg total) by mouth at bedtime. 06/28/20 09/26/20  Lorren Greig PARAS, NP  Insulin  Pen Needle (B-D UF III MINI PEN NEEDLES) 31G  X 5 MM MISC Use as instructed. Inject into the skin once nightly. Patient not taking: Reported on 04/06/2020 07/28/19   Fleming, Zelda W, NP  ketoconazole (NIZORAL) 2 % cream Apply 1 Application topically daily. 02/12/17   [provider]  lisinopril (ZESTRIL) 2.5 MG tablet Take 2.5 mg by mouth daily. 02/12/17   [provider]  loratadine (CLARITIN) 10 MG tablet Take 10 mg by mouth daily as needed. 04/09/17   [provider]  lovastatin (MEVACOR) 40 MG tablet Take 40 mg by mouth at bedtime. 04/09/17   [provider]  meclizine  (ANTIVERT ) 25 MG tablet Take 1 tablet (25 mg total) by mouth 3 (three) times daily as needed for dizziness. 10/31/21   Prosperi, Christian H, PA-C  omega-3 acid ethyl esters (LOVAZA ) 1 g capsule Take 2 capsules (2 g total) by mouth 2 (two) times daily. 08/04/19 09/03/19  Theotis Haze ORN, NP  Omega-3 Fatty Acids (FISH OIL CONCENTRATE PO) Take 1,000 mg by mouth daily. 10/21/16   [provider]  omeprazole  (PRILOSEC) 20 MG capsule Take 1 capsule (20 mg total) by mouth daily. To reduce stomach acid Patient not taking: Reported on 04/06/2020 07/28/19   Fleming, Zelda W, NP  PARoxetine (PAXIL) 20 MG tablet Take 20 mg by mouth daily. 10/21/16   [provider]  pravastatin  (PRAVACHOL ) 20 MG tablet Take 1 tablet (20 mg total) by mouth daily. 06/28/20   Lorren Greig PARAS, NP  TRUEplus Lancets 28G MISC Use as directed 06/28/20   Lorren Greig PARAS, NP    Allergies: Patient has no known allergies.    Review of Systems  Cardiovascular:  Positive for chest pain.  Gastrointestinal:  Negative for abdominal pain.  All other systems reviewed and are negative.   Updated Vital Signs BP 129/83   Pulse 81   Temp 98.3 F (36.8 C)   Resp (!) 22   Ht 5' 2 (1.575 m)   Wt 56.7 kg   LMP 02/09/2024 (Within Months)   SpO2 99%   BMI 22.86 kg/m   Physical Exam Vitals and nursing note reviewed.  Constitutional:      Appearance: She is well-developed.  HENT:     Head: Normocephalic.  Cardiovascular:     Rate and Rhythm: Normal rate and regular rhythm.     Heart sounds: Normal heart sounds.  Pulmonary:     Effort: Pulmonary effort is normal.     Breath sounds: Normal breath sounds.  Abdominal:     General: There is no distension.     Palpations: Abdomen is soft.  Musculoskeletal:        General: Normal range of motion.     Cervical back: Normal range of motion and neck supple.  Skin:    General: Skin is warm.  Neurological:     General: No focal deficit present.      Mental Status: She is alert and oriented to person, place, and time.     (all labs ordered are listed, but only abnormal results are displayed) Labs Reviewed  COMPREHENSIVE METABOLIC PANEL WITH GFR - Abnormal; Notable for the following components:      Result Value   Sodium 131 (*)    Chloride 97 (*)    Glucose, Bld 282 (*)    All other components within normal limits  CBG MONITORING, ED - Abnormal; Notable for the following components:   Glucose-Capillary 280 (*)    All other components within normal limits  CBC  ETHANOL  RAPID URINE DRUG SCREEN, HOSP PERFORMED  TROPONIN I (HIGH SENSITIVITY)  TROPONIN I (HIGH SENSITIVITY)    EKG: None  Radiology: DG Chest 2 View Result Date: 07/23/2024 CLINICAL DATA:  Chest pain. EXAM: CHEST - 2 VIEW COMPARISON:  Radiograph 10/31/2021 FINDINGS: The cardiomediastinal contours are normal. The lungs are clear. Pulmonary vasculature is normal. No consolidation, pleural effusion, or pneumothorax. No acute osseous abnormalities are seen. IMPRESSION: No active cardiopulmonary disease. Electronically Signed   By: Andrea Gasman M.D.   On: 07/23/2024 20:26     Procedures   Medications Ordered in the ED - No data to display                                  Medical Decision Making  Patient complains of chest pain for the past 3 weeks.  Patient has a past medical history of diabetes she is not taking her medications.  Patient reports her doctor retired and she does not have any provider.  Patient's daughter reports patient has been depressed recently and is asking for referrals for her depression as well as help with obtaining a primary care physician.  Amount and/or Complexity of Data Reviewed Labs: ordered. Decision-making details documented in ED Course.    Details: Labs ordered reviewed and interpreted troponin is negative.  Glucose is 280 Radiology: ordered and independent interpretation performed. Decision-making details documented in ED  Course.    Details: Chest x-ray shows no acute findings ECG/medicine tests: ordered and independent interpretation performed. Decision-making details documented in ED Course.    Details: EKG sinus rhythm normal EKG Discussion of management or test interpretation with external provider(s):  transition of care nurse was able to contact [facility] to get patient follow-up for her diabetes and for further evaluation of chest pain  Risk OTC drugs. Risk Details: Patient is given a prescription for metformin  1000 mg twice daily.  Patient is given a prescription for a glucose monitor and equipment. Patient is given the information for behavioral health to follow-up for evaluation of depression.  I discussed with patient and daughter that they can walk-in at behavioral health urgent care for assessment 24 hours a day if needed.  I advised return to the emergency [facility] if symptoms worsen or change.        Final diagnoses:  Atypical chest pain  Depression, unspecified depression type    ED Discharge Orders          Ordered    Blood Glucose Monitoring Suppl DEVI  3 times daily        07/23/24 2220    Glucose Blood (BLOOD GLUCOSE TEST STRIPS) STRP  3 times daily        07/23/24 2220    Lancet Device MISC  3 times daily        07/23/24 2220    Lancets Misc. MISC  3 times daily        07/23/24 2220    metFORMIN  (GLUCOPHAGE ) 1000 MG tablet  2 times daily with meals        07/23/24 2220            An After Visit Summary was printed and given to the patient.    Flint Sonny POUR, PA-C 07/24/24 0001    Zackowski, Scott, MD 07/24/24 [phone]

## 2024-07-23 NOTE — Discharge Instructions (Addendum)
 Return if any problems.  Call the clinic tomorrow to schedule appointment.

## 2024-07-24 ENCOUNTER — Other Ambulatory Visit (HOSPITAL_COMMUNITY): Payer: Self-pay

## 2024-11-25 ENCOUNTER — Encounter: Payer: Self-pay | Admitting: Family

## 2024-11-25 NOTE — Progress Notes (Signed)
 Erroneous encounter-disregard

## 2024-12-14 ENCOUNTER — Other Ambulatory Visit: Payer: Self-pay

## 2024-12-14 ENCOUNTER — Encounter (HOSPITAL_COMMUNITY): Payer: Self-pay | Admitting: Pharmacy Technician

## 2024-12-14 ENCOUNTER — Observation Stay (HOSPITAL_COMMUNITY)
Admission: EM | Admit: 2024-12-14 | Discharge: 2024-12-16 | Disposition: A | Payer: Self-pay | Attending: Internal Medicine | Admitting: Internal Medicine

## 2024-12-14 DIAGNOSIS — R7989 Other specified abnormal findings of blood chemistry: Secondary | ICD-10-CM | POA: Insufficient documentation

## 2024-12-14 DIAGNOSIS — E1110 Type 2 diabetes mellitus with ketoacidosis without coma: Principal | ICD-10-CM

## 2024-12-14 DIAGNOSIS — E1141 Type 2 diabetes mellitus with diabetic mononeuropathy: Secondary | ICD-10-CM | POA: Diagnosis present

## 2024-12-14 DIAGNOSIS — Z7984 Long term (current) use of oral hypoglycemic drugs: Secondary | ICD-10-CM | POA: Insufficient documentation

## 2024-12-14 DIAGNOSIS — E871 Hypo-osmolality and hyponatremia: Secondary | ICD-10-CM | POA: Insufficient documentation

## 2024-12-14 DIAGNOSIS — Z794 Long term (current) use of insulin: Secondary | ICD-10-CM | POA: Insufficient documentation

## 2024-12-14 DIAGNOSIS — E1310 Other specified diabetes mellitus with ketoacidosis without coma: Principal | ICD-10-CM

## 2024-12-14 LAB — BASIC METABOLIC PANEL WITH GFR
Anion gap: 10 (ref 5–15)
Anion gap: 9 (ref 5–15)
BUN: 15 mg/dL (ref 6–20)
BUN: 12 mg/dL (ref 6–20)
CO2: 24 mmol/L (ref 22–32)
CO2: 25 mmol/L (ref 22–32)
Calcium: 8.7 mg/dL — ABNORMAL LOW (ref 8.9–10.3)
Calcium: 8.7 mg/dL — ABNORMAL LOW (ref 8.9–10.3)
Chloride: 100 mmol/L (ref 98–111)
Chloride: 100 mmol/L (ref 98–111)
Creatinine, Ser: 0.52 mg/dL (ref 0.44–1.00)
Creatinine, Ser: 0.47 mg/dL (ref 0.44–1.00)
GFR, Estimated: >60 mL/min
GFR, Estimated: >60 mL/min
Glucose, Bld: 215 mg/dL — ABNORMAL HIGH (ref 70–99)
Glucose, Bld: 179 mg/dL — ABNORMAL HIGH (ref 70–99)
Potassium: 4 mmol/L (ref 3.5–5.1)
Potassium: 3.7 mmol/L (ref 3.5–5.1)
Sodium: 134 mmol/L — ABNORMAL LOW (ref 135–145)
Sodium: 133 mmol/L — ABNORMAL LOW (ref 135–145)

## 2024-12-14 LAB — URINALYSIS, ROUTINE W REFLEX MICROSCOPIC
Bacteria, UA: NONE SEEN
Bilirubin Urine: NEGATIVE
Glucose, UA: >=500 mg/dL — AB
Hgb urine dipstick: NEGATIVE
Ketones, ur: 80 mg/dL — AB
Nitrite: NEGATIVE
Protein, ur: 100 mg/dL — AB
Specific Gravity, Urine: 1.025 (ref 1.005–1.030)
pH: 5 (ref 5.0–8.0)

## 2024-12-14 LAB — CBC WITH DIFFERENTIAL/PLATELET
Abs Immature Granulocytes: 0.04 K/uL (ref 0.00–0.07)
Basophils Absolute: 0 K/uL (ref 0.0–0.1)
Basophils Relative: 0 %
Eosinophils Absolute: 0 K/uL (ref 0.0–0.5)
Eosinophils Relative: 0 %
HCT: 41.3 % (ref 36.0–46.0)
Hemoglobin: 14.4 g/dL (ref 12.0–15.0)
Immature Granulocytes: 1 %
Lymphocytes Relative: 8 %
Lymphs Abs: 0.7 K/uL (ref 0.7–4.0)
MCH: 31.3 pg (ref 26.0–34.0)
MCHC: 34.9 g/dL (ref 30.0–36.0)
MCV: 89.8 fL (ref 80.0–100.0)
Monocytes Absolute: 0.8 K/uL (ref 0.1–1.0)
Monocytes Relative: 9 %
Neutro Abs: 7.1 K/uL (ref 1.7–7.7)
Neutrophils Relative %: 82 %
Platelets: 146 K/uL — ABNORMAL LOW (ref 150–400)
RBC: 4.6 MIL/uL (ref 3.87–5.11)
RDW: 11.9 % (ref 11.5–15.5)
WBC: 8.7 K/uL (ref 4.0–10.5)
nRBC: 0 % (ref 0.0–0.2)

## 2024-12-14 LAB — COMPREHENSIVE METABOLIC PANEL WITH GFR
ALT: 18 U/L (ref 0–44)
AST: 20 U/L (ref 15–41)
Albumin: 3.8 g/dL (ref 3.5–5.0)
Alkaline Phosphatase: 86 U/L (ref 38–126)
Anion gap: 19 — ABNORMAL HIGH (ref 5–15)
BUN: 17 mg/dL (ref 6–20)
CO2: 17 mmol/L — ABNORMAL LOW (ref 22–32)
Calcium: 8.8 mg/dL — ABNORMAL LOW (ref 8.9–10.3)
Chloride: 93 mmol/L — ABNORMAL LOW (ref 98–111)
Creatinine, Ser: 0.61 mg/dL (ref 0.44–1.00)
GFR, Estimated: >60 mL/min
Glucose, Bld: 302 mg/dL — ABNORMAL HIGH (ref 70–99)
Potassium: 3.7 mmol/L (ref 3.5–5.1)
Sodium: 128 mmol/L — ABNORMAL LOW (ref 135–145)
Total Bilirubin: 0.5 mg/dL (ref 0.0–1.2)
Total Protein: 7.2 g/dL (ref 6.5–8.1)

## 2024-12-14 LAB — GLUCOSE, CAPILLARY
Glucose-Capillary: 206 mg/dL — ABNORMAL HIGH (ref 70–99)
Glucose-Capillary: 195 mg/dL — ABNORMAL HIGH (ref 70–99)
Glucose-Capillary: 198 mg/dL — ABNORMAL HIGH (ref 70–99)
Glucose-Capillary: 190 mg/dL — ABNORMAL HIGH (ref 70–99)
Glucose-Capillary: 180 mg/dL — ABNORMAL HIGH (ref 70–99)
Glucose-Capillary: 184 mg/dL — ABNORMAL HIGH (ref 70–99)
Glucose-Capillary: 193 mg/dL — ABNORMAL HIGH (ref 70–99)

## 2024-12-14 LAB — CBG MONITORING, ED
Glucose-Capillary: 287 mg/dL — ABNORMAL HIGH (ref 70–99)
Glucose-Capillary: 209 mg/dL — ABNORMAL HIGH (ref 70–99)

## 2024-12-14 LAB — HEMOGLOBIN A1C
Hgb A1c MFr Bld: 13 % — ABNORMAL HIGH (ref 4.8–5.6)
Mean Plasma Glucose: 326.4 mg/dL

## 2024-12-14 LAB — LIPASE, BLOOD: Lipase: 19 U/L (ref 11–51)

## 2024-12-14 LAB — BETA-HYDROXYBUTYRIC ACID: Beta-Hydroxybutyric Acid: 0.84 mmol/L — ABNORMAL HIGH (ref 0.05–0.27)

## 2024-12-14 MED ORDER — ONDANSETRON HCL 4 MG PO TABS: 4.0000 mg | ORAL_TABLET | Freq: Four times a day (QID) | ORAL | Status: DC | PRN

## 2024-12-14 MED ORDER — LACTATED RINGERS IV BOLUS
1000.0000 mL | Freq: Once | INTRAVENOUS | Status: AC
  Administered 2024-12-14: 1000 mL via INTRAVENOUS

## 2024-12-14 MED ORDER — INSULIN REGULAR(HUMAN) IN NACL 100-0.9 UT/100ML-% IV SOLN
INTRAVENOUS | Status: DC
  Administered 2024-12-14: 8 [IU]/h via INTRAVENOUS
  Filled 2024-12-14: qty 100

## 2024-12-14 MED ORDER — ENOXAPARIN SODIUM 40 MG/0.4ML IJ SOSY
40.0000 mg | PREFILLED_SYRINGE | INTRAMUSCULAR | Status: DC
  Administered 2024-12-15: 40 mg via SUBCUTANEOUS
  Filled 2024-12-14: qty 0.4

## 2024-12-14 MED ORDER — POTASSIUM CHLORIDE 10 MEQ/100ML IV SOLN
10.0000 meq | INTRAVENOUS | Status: AC
  Administered 2024-12-14: 10 meq via INTRAVENOUS
  Filled 2024-12-14: qty 100

## 2024-12-14 MED ORDER — ONDANSETRON 4 MG PO TBDP: 4.0000 mg | ORAL_TABLET | Freq: Once | ORAL | Status: DC

## 2024-12-14 MED ORDER — DEXTROSE 50 % IV SOLN: 0.0000 mL | INTRAVENOUS | Status: DC | PRN

## 2024-12-14 MED ORDER — ONDANSETRON HCL 4 MG/2ML IJ SOLN: 4.0000 mg | Freq: Four times a day (QID) | INTRAMUSCULAR | Status: DC | PRN

## 2024-12-14 MED ORDER — ACETAMINOPHEN 325 MG PO TABS
650.0000 mg | ORAL_TABLET | Freq: Four times a day (QID) | ORAL | Status: DC | PRN
  Administered 2024-12-14 – 2024-12-16 (×5): 650 mg via ORAL
  Filled 2024-12-14 (×6): qty 2

## 2024-12-14 MED ORDER — ONDANSETRON 4 MG PO TBDP: 4.0000 mg | ORAL_TABLET | Freq: Once | ORAL | Status: DC | PRN

## 2024-12-14 MED ORDER — DEXTROSE IN LACTATED RINGERS 5 % IV SOLN: INTRAVENOUS | Status: AC

## 2024-12-14 MED ORDER — TRAZODONE HCL 50 MG PO TABS: 25.0000 mg | ORAL_TABLET | Freq: Every evening | ORAL | Status: DC | PRN

## 2024-12-14 MED ORDER — LACTATED RINGERS IV SOLN: INTRAVENOUS | Status: AC

## 2024-12-14 MED ORDER — ALBUTEROL SULFATE (2.5 MG/3ML) 0.083% IN NEBU: 2.5000 mg | INHALATION_SOLUTION | RESPIRATORY_TRACT | Status: DC | PRN

## 2024-12-14 MED ORDER — ACETAMINOPHEN 325 MG PO TABS
650.0000 mg | ORAL_TABLET | Freq: Once | ORAL | Status: AC
  Administered 2024-12-14: 650 mg via ORAL
  Filled 2024-12-14: qty 2

## 2024-12-14 MED ORDER — ACETAMINOPHEN 650 MG RE SUPP: 650.0000 mg | Freq: Four times a day (QID) | RECTAL | Status: DC | PRN

## 2024-12-14 MED ORDER — ONDANSETRON HCL 4 MG/2ML IJ SOLN
4.0000 mg | Freq: Once | INTRAMUSCULAR | Status: AC
  Administered 2024-12-14: 4 mg via INTRAVENOUS
  Filled 2024-12-14: qty 2

## 2024-12-14 MED ORDER — LACTATED RINGERS IV BOLUS: 20.0000 mL/kg | Freq: Once | INTRAVENOUS | Status: DC

## 2024-12-14 NOTE — ED Triage Notes (Signed)
 Pt here with reports of chills, headache, abdominal pain, emesis X2 for the last few days.

## 2024-12-14 NOTE — ED Provider Notes (Signed)
 " [place] EMERGENCY DEPARTMENT AT [facility] Provider Note   CSN: [id number] Arrival date & time: 12/14/24  9071     Patient presents with: Chills, Headache, and Generalized Body Aches (/)   Faith Alvarez is a 54 y.o. female.   Patient is here for evaluation of chills, headache, abdominal pain, malaise, nausea and vomiting.  Patient is accompanied by her daughter who lives with her and facilitated our history taking by translating.  Patient first started having chills 3 days ago with malaise.  Yesterday she had nausea and vomiting.  She continues to have nausea with no episodes of vomiting today and persistent malaise.  Patient is diabetic and daughter states that she has not taken her medication in probably over 1 year.  In the past when her blood sugar was high she had similar symptoms.  Daughter and patient are not sure what medication she is to take, only that it was a tablet.  Daughter also notes another family member recently visited and was experiencing similar symptoms last week.  No known fevers.  Denies shortness of breath, chest pain, syncope, confusion, dysuria, hematuria, increased urinary frequency, cough, congestion.  Abdominal pain is localized to periumbilicus.  The history is provided by the patient. The history is limited by a language barrier. No language interpreter was used (Daughter translated).       Prior to Admission medications  Medication Sig Start Date End Date Taking? Authorizing Provider  benzonatate (TESSALON) 200 MG capsule Take 200 mg by mouth 3 (three) times daily as needed. 04/09/17   [provider]  blood glucose meter kit and supplies     [provider]  Blood Glucose Monitoring Suppl (BLOOD GLUCOSE MONITOR SYSTEM) w/Device KIT Use to check blood sugar three times daily as directed  in the morning, at noon, and at bedtime 07/23/24   Sofia, Leslie K, PA-C  diclofenac  (VOLTAREN ) 50 MG EC tablet Take 1 tablet (50  mg total) by mouth 2 (two) times daily. 06/28/24   Reddick, Johnathan B, NP  fluconazole  (DIFLUCAN ) 150 MG tablet Take 150 mg by mouth once. 02/12/17   [provider]  FLUoxetine  (PROZAC ) 10 MG tablet Take 1 tablet (10 mg total) by mouth daily. 06/28/20   Jaycee Greig PARAS, NP  gabapentin  (NEURONTIN ) 100 MG capsule Take 1 capsule (100 mg total) by mouth at bedtime. 06/28/20 09/26/20  Jaycee Greig PARAS, NP  Insulin  Pen Needle (B-D UF III MINI PEN NEEDLES) 31G X 5 MM MISC Use as instructed. Inject into the skin once nightly. Patient not taking: Reported on 04/06/2020 07/28/19   Fleming, Zelda W, NP  ketoconazole (NIZORAL) 2 % cream Apply 1 Application topically daily. 02/12/17   [provider]  lisinopril (ZESTRIL) 2.5 MG tablet Take 2.5 mg by mouth daily. 02/12/17   [provider]  loratadine (CLARITIN) 10 MG tablet Take 10 mg by mouth daily as needed. 04/09/17   [provider]  lovastatin (MEVACOR) 40 MG tablet Take 40 mg by mouth at bedtime. 04/09/17   [provider]  meclizine  (ANTIVERT ) 25 MG tablet Take 1 tablet (25 mg total) by mouth 3 (three) times daily as needed for dizziness. 10/31/21   Prosperi, Christian H, PA-C  metFORMIN  (GLUCOPHAGE ) 1000 MG tablet Take 1 tablet (1,000 mg total) by mouth 2 (two) times daily with a meal. 07/23/24   Flint Sonny POUR, PA-C  omega-3 acid ethyl esters (LOVAZA ) 1 g capsule Take 2 capsules (2 g total) by mouth  2 (two) times daily. 08/04/19 09/03/19  Theotis Haze ORN, NP  Omega-3 Fatty Acids (FISH OIL CONCENTRATE PO) Take 1,000 mg by mouth daily. 10/21/16   [provider]  omeprazole  (PRILOSEC) 20 MG capsule Take 1 capsule (20 mg total) by mouth daily. To reduce stomach acid Patient not taking: Reported on 04/06/2020 07/28/19   Fleming, Zelda W, NP  PARoxetine (PAXIL) 20 MG tablet Take 20 mg by mouth daily. 10/21/16   [provider]  pravastatin  (PRAVACHOL ) 20 MG tablet Take 1 tablet (20 mg total) by mouth daily.  06/28/20   Jaycee Greig PARAS, NP  TRUEplus Lancets 28G MISC Use as directed 06/28/20   Jaycee Greig PARAS, NP    Allergies: Patient has no known allergies.    Review of Systems  Constitutional:  Positive for chills and fatigue.  Gastrointestinal:  Positive for nausea.   Vitals:   12/14/24 0936 12/14/24 1055  BP: 110/71 121/71  Pulse: (!) 110 95  Resp: 18 14  Temp: 99.4 F (37.4 C) 98.4 F (36.9 C)  TempSrc: Oral Oral  SpO2: 98% 98%    Updated Vital Signs BP 121/71 (BP Location: Right Arm)   Pulse 95   Temp 98.4 F (36.9 C) (Oral)   Resp 14   SpO2 98%   Physical Exam Vitals and nursing note reviewed.  Constitutional:      General: She is not in acute distress.    Appearance: Normal appearance. She is well-developed. She is ill-appearing. She is not toxic-appearing or diaphoretic.  HENT:     Head: Normocephalic and atraumatic.     Nose: Nose normal.     Mouth/Throat:     Mouth: Mucous membranes are moist.     Pharynx: Oropharynx is clear. No oropharyngeal exudate or posterior oropharyngeal erythema.  Eyes:     General: No scleral icterus.    Extraocular Movements: Extraocular movements intact.     Conjunctiva/sclera: Conjunctivae normal.     Pupils: Pupils are equal, round, and reactive to light.  Cardiovascular:     Rate and Rhythm: Regular rhythm. Tachycardia present.     Pulses: Normal pulses.     Heart sounds: Normal heart sounds.  Pulmonary:     Effort: Pulmonary effort is normal. No respiratory distress.     Breath sounds: Normal breath sounds. No stridor. No wheezing, rhonchi or rales.  Abdominal:     General: Abdomen is flat. Bowel sounds are normal. There is no distension.     Palpations: Abdomen is soft.     Tenderness: There is abdominal tenderness (periumbilical). There is no right CVA tenderness, left CVA tenderness or guarding.  Musculoskeletal:        General: Normal range of motion.     Cervical back: Normal range of motion. No tenderness.     Right  lower leg: No edema.     Left lower leg: No edema.  Lymphadenopathy:     Cervical: No cervical adenopathy.  Skin:    General: Skin is warm and dry.     Capillary Refill: Capillary refill takes less than 2 seconds.     Coloration: Skin is not jaundiced or pale.     Findings: No bruising, erythema or rash.  Neurological:     Mental Status: She is alert and oriented to person, place, and time.     (all labs ordered are listed, but only abnormal results are displayed) Labs Reviewed  CBC WITH DIFFERENTIAL/PLATELET - Abnormal; Notable for the following components:  Result Value   Platelets 146 (*)    All other components within normal limits  COMPREHENSIVE METABOLIC PANEL WITH GFR - Abnormal; Notable for the following components:   Sodium 128 (*)    Chloride 93 (*)    CO2 17 (*)    Glucose, Bld 302 (*)    Calcium  8.8 (*)    Anion gap 19 (*)    All other components within normal limits  URINALYSIS, ROUTINE W REFLEX MICROSCOPIC - Abnormal; Notable for the following components:   APPearance HAZY (*)    Glucose, UA >=500 (*)    Ketones, ur 80 (*)    Protein, ur 100 (*)    Leukocytes,Ua TRACE (*)    All other components within normal limits  LIPASE, BLOOD  BETA-HYDROXYBUTYRIC ACID  BASIC METABOLIC PANEL WITH GFR  BASIC METABOLIC PANEL WITH GFR  BASIC METABOLIC PANEL WITH GFR  BASIC METABOLIC PANEL WITH GFR  BLOOD GAS, VENOUS  CBG MONITORING, ED    EKG: EKG Interpretation Date/Time:  Sunday December 14 2024 10:54:01 EST Ventricular Rate:  94 PR Interval:  130 QRS Duration:  77 QT Interval:  341 QTC Calculation: 427 R Axis:   79  Text Interpretation: Sinus rhythm Paired ventricular premature complexes Aberrant complex Right atrial enlargement Nonspecific repol abnormality, inferior leads when compard to prior, similar appearance No STEMI Confirmed by Ginger Barefoot ([id number]) on 12/14/2024 1:00:38 PM  Radiology: No results found.   .Critical Care  Performed by: Rosina Almarie LABOR, PA-C Authorized by: Rosina Almarie LABOR, PA-C   Critical care provider statement:    Critical care time (minutes):  30   Critical care was necessary to treat or prevent imminent or life-threatening deterioration of the following conditions:  Metabolic crisis   Critical care was time spent personally by me on the following activities:  Development of treatment plan with patient or surrogate, discussions with consultants, evaluation of patient's response to treatment, examination of patient, obtaining history from patient or surrogate, ordering and performing treatments and interventions, ordering and review of laboratory studies, re-evaluation of patient's condition and review of old charts    Medications Ordered in the ED  lactated ringers  bolus 20 mL/kg (has no administration in time range)  insulin  regular, human (MYXREDLIN) 100 units/ 100 mL infusion (has no administration in time range)  lactated ringers  infusion (has no administration in time range)  dextrose  5 % in lactated ringers  infusion (has no administration in time range)  dextrose  50 % solution 0-50 mL (has no administration in time range)  potassium chloride  10 mEq in 100 mL IVPB (has no administration in time range)  ondansetron  (ZOFRAN ) injection 4 mg (has no administration in time range)  acetaminophen  (TYLENOL ) tablet 650 mg (has no administration in time range)    Patient presents to the ED for concern of chills, malaise, N/V, this involves an extensive number of treatment options, and is a complaint that carries with it a high risk of complications and morbidity.  The differential diagnosis includes DKA, pancreatitis, UTI, viral URI, gastroenteritis, food borne illness, ACS.  Lipase normal. History not suspicious for food borne illness or UTI. EKG and history not suspicious for ACS. Cannot rule out viral URI or gastroenteritis. Work up favors DKA.  Co morbidities that complicate the patient  evaluation  Diabetes, untreated   Additional history obtained:  Additional history obtained from Family and Outside Medical Records   External records from outside source obtained and reviewed including additional history provided by daughter and  prior lab reviews.   Lab Tests:  I Ordered, and personally interpreted labs.  The pertinent results include:  Urinalysis not indicate of infection, ketonuria noted. Glucose 302. Anion gap 19.   Cardiac Monitoring:  The patient was maintained on a cardiac monitor.  I personally viewed and interpreted the cardiac monitored which showed an underlying rhythm of: Sinus rhythm with no evidence of STEMI   Medicines ordered and prescription drug management:  I ordered medication including tylenol  and zofran   for nausea and headache. Potassium chloride , insulin , LR infusion for DKA.  Reevaluation of the patient after these medicines showed that the patient improved   Consultations Obtained:  I requested consultation with the hospitalist (Mir [place] MD),  and discussed lab and imaging findings as well as pertinent plan - they recommend: Admission to hospital for ongoing management and treatment of DKA.   Problem List / ED Course:  Clinical Course as of 12/14/24 1357  Sun Dec 14, 2024  1258 Resp panel by RT-PCR (RSV, Flu A&B, Covid) Anterior Nasal Swab [EA]    Clinical Course User Index [EA] Rosina Almarie LABOR, PA-C    DKA. Urinalysis showing ketones. Anion gap 19 and glucose 302. History of untreated diabetes. Suspect early DKA. Ordered beta hydroxy, DKA meds, and consult to hospitalist for admission.   Reevaluation:  After the interventions noted above, I reevaluated the patient and found that they have :improved   Dispostion:  Admission to hospital for ongoing management of DKA.  Clinical Course as of 12/14/24 1357  Sun Dec 14, 2024  1258 Resp panel by RT-PCR (RSV, Flu A&B, Covid) Anterior Nasal Swab [EA]    Clinical  Course User Index [EA] Rosina Almarie LABOR, PA-C                                 Medical Decision Making Amount and/or Complexity of Data Reviewed Labs: ordered. Decision-making details documented in ED Course. ECG/medicine tests: ordered.  Risk OTC drugs. Prescription drug management.   This note was produced using Electronics Engineer. While the provider has reviewed and verified all clinical information, transcription errors may remain.    Final diagnoses:  Diabetic ketoacidosis without coma associated with other specified diabetes mellitus [facility])    ED Discharge Orders     None          Rosina Almarie LABOR DEVONNA 12/14/24 1507    Tegeler, Lonni PARAS, MD 12/14/24 [phone]  "

## 2024-12-14 NOTE — H&P (Signed)
 " History and Physical  Christan Ciccarelli FMW:[id number] DOB: 02-Nov-1971 DOA: 12/14/2024  PCP: Patient, No Pcp Per   Chief Complaint: Vomiting, anorexia  HPI: Faith Alvarez is a 54 y.o. female with medical history significant for type 2 diabetes who has not seen a physician in years and does not take any medication being admitted to the hospital with several days of vomiting, lethargy, body aches found to have DKA.  History is provided mainly by her oldest daughter with whom she lives.  Apparently the patient is supposed to be on medications for her diabetes, but refuses to take any medications or follow-up with a primary care doctor.  It seems that for the last 2 to 3 days she has been having generalized bodyaches, weakness, dizziness, with some nonbloody nonbilious emesis.  She denies any significant abdominal pain, dysuria, diarrhea, cough or shortness of breath.  Review of Systems: Please see HPI for pertinent positives and negatives. A complete 10 system review of systems are otherwise negative.  Past Medical History:  Diagnosis Date   Anxiety    Depression    Diabetes mellitus without complication (HCC)    Diabetic neuropathy (HCC)    GERD (gastroesophageal reflux disease)    Hyperlipidemia    History reviewed. No pertinent surgical history. Social History:  reports that she has never smoked. She has never been exposed to tobacco smoke. She has never used smokeless tobacco. She reports that she does not drink alcohol and does not use drugs.  Allergies[1]  Family History  Problem Relation Age of Onset   Diabetes Mother    Diabetes Sister      Prior to Admission medications  Medication Sig Start Date End Date Taking? Authorizing Provider  benzonatate (TESSALON) 200 MG capsule Take 200 mg by mouth 3 (three) times daily as needed. 04/09/17   [provider]  blood glucose meter kit and supplies     [provider]  Blood Glucose Monitoring  Suppl (BLOOD GLUCOSE MONITOR SYSTEM) w/Device KIT Use to check blood sugar three times daily as directed  in the morning, at noon, and at bedtime 07/23/24   Sofia, Leslie K, PA-C  diclofenac  (VOLTAREN ) 50 MG EC tablet Take 1 tablet (50 mg total) by mouth 2 (two) times daily. 06/28/24   Reddick, Johnathan B, NP  fluconazole  (DIFLUCAN ) 150 MG tablet Take 150 mg by mouth once. 02/12/17   [provider]  FLUoxetine  (PROZAC ) 10 MG tablet Take 1 tablet (10 mg total) by mouth daily. 06/28/20   Jaycee Greig PARAS, NP  gabapentin  (NEURONTIN ) 100 MG capsule Take 1 capsule (100 mg total) by mouth at bedtime. 06/28/20 09/26/20  Jaycee Greig PARAS, NP  Insulin  Pen Needle (B-D UF III MINI PEN NEEDLES) 31G X 5 MM MISC Use as instructed. Inject into the skin once nightly. Patient not taking: Reported on 04/06/2020 07/28/19   Fleming, Zelda W, NP  ketoconazole (NIZORAL) 2 % cream Apply 1 Application topically daily. 02/12/17   [provider]  lisinopril (ZESTRIL) 2.5 MG tablet Take 2.5 mg by mouth daily. 02/12/17   [provider]  loratadine (CLARITIN) 10 MG tablet Take 10 mg by mouth daily as needed. 04/09/17   [provider]  lovastatin (MEVACOR) 40 MG tablet Take 40 mg by mouth at bedtime. 04/09/17   [provider]  meclizine  (ANTIVERT ) 25 MG tablet Take 1 tablet (25 mg total) by mouth 3 (three) times daily as needed for dizziness. 10/31/21   Prosperi, Sherlean H, PA-C  metFORMIN  (GLUCOPHAGE ) 1000 MG tablet Take 1 tablet (1,000 mg total) by mouth 2 (two) times daily with a meal. 07/23/24   Flint Sonny POUR, PA-C  omega-3 acid ethyl esters (LOVAZA ) 1 g capsule Take 2 capsules (2 g total) by mouth 2 (two) times daily. 08/04/19 09/03/19  Theotis Haze ORN, NP  Omega-3 Fatty Acids (FISH OIL CONCENTRATE PO) Take 1,000 mg by mouth daily. 10/21/16   [provider]  omeprazole  (PRILOSEC) 20 MG capsule Take 1 capsule (20 mg total) by mouth daily. To reduce stomach acid Patient not taking:  Reported on 04/06/2020 07/28/19   Fleming, Zelda W, NP  PARoxetine (PAXIL) 20 MG tablet Take 20 mg by mouth daily. 10/21/16   [provider]  pravastatin  (PRAVACHOL ) 20 MG tablet Take 1 tablet (20 mg total) by mouth daily. 06/28/20   Jaycee Greig PARAS, NP  TRUEplus Lancets 28G MISC Use as directed 06/28/20   Jaycee Greig PARAS, NP    Physical Exam: BP 121/71 (BP Location: Right Arm)   Pulse 95   Temp 98.4 F (36.9 C) (Oral)   Resp 14   SpO2 98%  General:  Alert, oriented, calm, in no acute distress.  Patient is Spanish-speaking, her daughter is at the bedside and prefers to translate, declines Spanish interpreter. Cardiovascular: RRR, no murmurs or rubs, no peripheral edema  Respiratory: clear to auscultation bilaterally, no wheezes, no crackles  Abdomen: soft, nontender, nondistended, normal bowel tones heard  Skin: dry, no rashes  Musculoskeletal: no joint effusions, normal range of motion  Psychiatric: appropriate affect, normal speech  Neurologic: extraocular muscles intact, clear speech, moving all extremities with intact sensorium         Labs on Admission:  Basic Metabolic Panel: Recent Labs  Lab 12/14/24 1121  NA 128*  K 3.7  CL 93*  CO2 17*  GLUCOSE 302*  BUN 17  CREATININE 0.61  CALCIUM  8.8*   Liver Function Tests: Recent Labs  Lab 12/14/24 1121  AST 20  ALT 18  ALKPHOS 86  BILITOT 0.5  PROT 7.2  ALBUMIN 3.8   Recent Labs  Lab 12/14/24 1121  LIPASE 19   No results for input(s): AMMONIA in the last 168 hours. CBC: Recent Labs  Lab 12/14/24 1121  WBC 8.7  NEUTROABS 7.1  HGB 14.4  HCT 41.3  MCV 89.8  PLT 146*   Cardiac Enzymes: No results for input(s): CKTOTAL, CKMB, CKMBINDEX, TROPONINI in the last 168 hours. BNP (last 3 results) No results for input(s): BNP in the last 8760 hours.  ProBNP (last 3 results) No results for input(s): PROBNP in the last 8760 hours.  CBG: No results for input(s): GLUCAP in the last 168  hours.  Radiological Exams on Admission: No results found. Assessment/Plan Jennife Zaucha is a 54 y.o. female with medical history significant for type 2 diabetes who has not seen a physician in years and does not take any medication being admitted to the hospital with several days of vomiting, lethargy, body aches found to have DKA.   DKA-in the setting of untreated diabetes.  No obvious infectious source identified. -Observation admission -Monitor closely on stepdown -IV insulin  drip and fluids per DKA protocol -Keep n.p.o. until anion gap is closed -Check hemoglobin A1c and BMP every 4 hours  Pseudohyponatremia-due to hyperglycemia, will monitor with regular BMPs as above  Healthcare maintenance-Will check fasting lipid panel  DVT prophylaxis: Lovenox      Code Status: Full Code  Consults called: None  Admission status: Observation  Time spent: 48 minutes  Kerra Guilfoil CHRISTELLA Gail MD [facility] Pager [phone]  If 7PM-7AM, please contact night-coverage [email or web address] Password [email or web address]  12/14/2024, 2:04 PM      [1] No Known Allergies  "

## 2024-12-14 NOTE — ED Notes (Signed)
 Pt states that she is also dizzy. Medic was notified.

## 2024-12-15 ENCOUNTER — Other Ambulatory Visit (HOSPITAL_COMMUNITY): Payer: Self-pay

## 2024-12-15 DIAGNOSIS — R7989 Other specified abnormal findings of blood chemistry: Secondary | ICD-10-CM | POA: Insufficient documentation

## 2024-12-15 DIAGNOSIS — E1310 Other specified diabetes mellitus with ketoacidosis without coma: Secondary | ICD-10-CM

## 2024-12-15 LAB — BASIC METABOLIC PANEL WITH GFR
Anion gap: 8 (ref 5–15)
Anion gap: 8 (ref 5–15)
BUN: 11 mg/dL (ref 6–20)
BUN: 9 mg/dL (ref 6–20)
CO2: 24 mmol/L (ref 22–32)
CO2: 27 mmol/L (ref 22–32)
Calcium: 8.4 mg/dL — ABNORMAL LOW (ref 8.9–10.3)
Calcium: 8.4 mg/dL — ABNORMAL LOW (ref 8.9–10.3)
Chloride: 100 mmol/L (ref 98–111)
Chloride: 100 mmol/L (ref 98–111)
Creatinine, Ser: 0.47 mg/dL (ref 0.44–1.00)
Creatinine, Ser: 0.5 mg/dL (ref 0.44–1.00)
GFR, Estimated: >60 mL/min
GFR, Estimated: >60 mL/min
Glucose, Bld: 144 mg/dL — ABNORMAL HIGH (ref 70–99)
Glucose, Bld: 211 mg/dL — ABNORMAL HIGH (ref 70–99)
Potassium: 3.4 mmol/L — ABNORMAL LOW (ref 3.5–5.1)
Potassium: 3.7 mmol/L (ref 3.5–5.1)
Sodium: 133 mmol/L — ABNORMAL LOW (ref 135–145)
Sodium: 134 mmol/L — ABNORMAL LOW (ref 135–145)

## 2024-12-15 LAB — LIPID PANEL
Cholesterol: 181 mg/dL (ref 0–200)
HDL: 40 mg/dL — ABNORMAL LOW
LDL Cholesterol: 114 mg/dL — ABNORMAL HIGH (ref 0–99)
Total CHOL/HDL Ratio: 4.6 ratio
Triglycerides: 135 mg/dL
VLDL: 27 mg/dL (ref 0–40)

## 2024-12-15 LAB — GLUCOSE, CAPILLARY
Glucose-Capillary: 157 mg/dL — ABNORMAL HIGH (ref 70–99)
Glucose-Capillary: 176 mg/dL — ABNORMAL HIGH (ref 70–99)
Glucose-Capillary: 178 mg/dL — ABNORMAL HIGH (ref 70–99)
Glucose-Capillary: 178 mg/dL — ABNORMAL HIGH (ref 70–99)
Glucose-Capillary: 189 mg/dL — ABNORMAL HIGH (ref 70–99)
Glucose-Capillary: 195 mg/dL — ABNORMAL HIGH (ref 70–99)
Glucose-Capillary: 201 mg/dL — ABNORMAL HIGH (ref 70–99)
Glucose-Capillary: 204 mg/dL — ABNORMAL HIGH (ref 70–99)
Glucose-Capillary: 205 mg/dL — ABNORMAL HIGH (ref 70–99)
Glucose-Capillary: 207 mg/dL — ABNORMAL HIGH (ref 70–99)
Glucose-Capillary: 208 mg/dL — ABNORMAL HIGH (ref 70–99)
Glucose-Capillary: 216 mg/dL — ABNORMAL HIGH (ref 70–99)

## 2024-12-15 LAB — CBC
HCT: 39.5 % (ref 36.0–46.0)
Hemoglobin: 13.7 g/dL (ref 12.0–15.0)
MCH: 31.2 pg (ref 26.0–34.0)
MCHC: 34.7 g/dL (ref 30.0–36.0)
MCV: 90 fL (ref 80.0–100.0)
Platelets: 145 K/uL — ABNORMAL LOW (ref 150–400)
RBC: 4.39 MIL/uL (ref 3.87–5.11)
RDW: 11.9 % (ref 11.5–15.5)
WBC: 4.3 K/uL (ref 4.0–10.5)
nRBC: 0 % (ref 0.0–0.2)

## 2024-12-15 LAB — BETA-HYDROXYBUTYRIC ACID
Beta-Hydroxybutyric Acid: 0.31 mmol/L — ABNORMAL HIGH (ref 0.05–0.27)
Beta-Hydroxybutyric Acid: 0.14 mmol/L (ref 0.05–0.27)

## 2024-12-15 LAB — HIV ANTIBODY (ROUTINE TESTING W REFLEX): HIV Screen 4th Generation wRfx: NONREACTIVE

## 2024-12-15 MED ORDER — POTASSIUM CHLORIDE CRYS ER 20 MEQ PO TBCR
20.0000 meq | EXTENDED_RELEASE_TABLET | Freq: Once | ORAL | Status: AC
  Administered 2024-12-15: 20 meq via ORAL
  Filled 2024-12-15: qty 1

## 2024-12-15 MED ORDER — INSULIN ASPART 100 UNIT/ML IJ SOLN
4.0000 [IU] | Freq: Three times a day (TID) | INTRAMUSCULAR | Status: DC
  Administered 2024-12-15 – 2024-12-16 (×5): 4 [IU] via SUBCUTANEOUS
  Filled 2024-12-15 (×5): qty 4

## 2024-12-15 MED ORDER — INSULIN GLARGINE-YFGN 100 UNIT/ML ~~LOC~~ SOLN
20.0000 [IU] | Freq: Two times a day (BID) | SUBCUTANEOUS | Status: DC
  Administered 2024-12-15 – 2024-12-16 (×3): 20 [IU] via SUBCUTANEOUS
  Filled 2024-12-15 (×4): qty 0.2

## 2024-12-15 MED ORDER — POTASSIUM CHLORIDE CRYS ER 20 MEQ PO TBCR
40.0000 meq | EXTENDED_RELEASE_TABLET | Freq: Once | ORAL | Status: AC
  Administered 2024-12-15: 40 meq via ORAL
  Filled 2024-12-15: qty 2

## 2024-12-15 MED ORDER — ROSUVASTATIN CALCIUM 10 MG PO TABS
10.0000 mg | ORAL_TABLET | Freq: Every day | ORAL | Status: DC
  Administered 2024-12-15 – 2024-12-16 (×2): 10 mg via ORAL
  Filled 2024-12-15 (×2): qty 1

## 2024-12-15 MED ORDER — INSULIN ASPART 100 UNIT/ML IJ SOLN
0.0000 [IU] | Freq: Three times a day (TID) | INTRAMUSCULAR | Status: DC
  Administered 2024-12-15: 5 [IU] via SUBCUTANEOUS
  Administered 2024-12-15 – 2024-12-16 (×3): 3 [IU] via SUBCUTANEOUS
  Administered 2024-12-16: 5 [IU] via SUBCUTANEOUS
  Filled 2024-12-15: qty 5
  Filled 2024-12-15 (×3): qty 3
  Filled 2024-12-15: qty 5

## 2024-12-15 MED ORDER — INSULIN ASPART 100 UNIT/ML IJ SOLN
0.0000 [IU] | Freq: Every day | INTRAMUSCULAR | Status: DC
  Administered 2024-12-15: 2 [IU] via SUBCUTANEOUS
  Filled 2024-12-15: qty 2

## 2024-12-15 MED ORDER — INSULIN STARTER KIT- PEN NEEDLES (SPANISH)
1.0000 | Freq: Once | Status: DC
  Filled 2024-12-15: qty 1

## 2024-12-15 MED ORDER — LIVING WELL WITH DIABETES BOOK - IN SPANISH
Freq: Once | Status: DC
  Filled 2024-12-15: qty 1

## 2024-12-15 NOTE — Plan of Care (Signed)

## 2024-12-15 NOTE — Inpatient Diabetes Management (Signed)
 Inpatient Diabetes Program Recommendations  AACE/ADA: New Consensus Statement on Inpatient Glycemic Control (2015)  Target Ranges:  Prepandial:   less than 140 mg/dL      Peak postprandial:   less than 180 mg/dL (1-2 hours)      Critically ill patients:  140 - 180 mg/dL   Lab Results  Component Value Date   GLUCAP 178 (H) 12/15/2024   HGBA1C 13.0 (H) 12/14/2024    Review of Glycemic Control  Latest Reference Range & Units 12/15/24 06:21 12/15/24 07:21 12/15/24 11:48  Glucose-Capillary 70 - 99 mg/dL 794 (H) 792 (H) 821 (H)  (H): Data is abnormally high  Diabetes history: DM2 Outpatient Diabetes medications: Metformin  1000 mg BID (not taking) Current orders for Inpatient glycemic control: Lantus  20 units every day, Novolog  0-15 units TID and 0-5 units at bedtime, Novolog  4 units TID  Met with patient and daughters at bedside.  She does not take Metformin , does not have a PCP or insurance.  She has a history of GDM with her pregnancies.  After her last pregnancy she was diagnosed with DM2.  Reviewed pathophysiology of DM, ordered the LWWDM booklet and insulin  starter kit.    Reviewed patient's current A1c of 13%. Explained what a A1c is and what it measures. Also reviewed goal A1c with patient, importance of good glucose control @ home, and blood sugar goals.  Provided her with a ReliOn glucometer as she does not have insurance.  She should check her glucose ac/hs and if she feels hypoglycemic.  She is not interested in a CGM.  Educated on The Plate Method, CHO's, portion control, avoiding caloric beverages, CBGs at home fasting and mid afternoon, F/U with PCP every 3 months, bring meter to PCP office, long and short term complications of uncontrolled BG, and importance of exercise.  Educated patient and daughters on insulin  pen use at home. Reviewed contents of insulin  flexpen starter kit. Reviewed all steps of insulin  pen including attachment of needle, 2-unit air shot, dialing up dose,  giving injection, removing needle, disposal of sharps, storage of unused insulin , disposal of insulin  etc. daughter able to provide successful return demonstration. Also reviewed troubleshooting with insulin  pen. MD to give patient Rxs for insulin  pens and insulin  pen needles.  Daughter used to give her grandmother insulin  pen injections and is familiar with DM and the insulin  pen.  Reviewed hypoglycemia, < 70 mg/dL, signs, symptoms and treatments.   TOC has been consulted for PCP and medication assistance/MATCH.  Will also provide patient with information on the [facility] program through the [employer] [facility] [employer].  They can assist her with insulins as she is uninsured.    Thank you, Wyvonna Pinal, MSN, CDCES Diabetes Coordinator Inpatient Diabetes Program [phone] (team pager from 8a-5p)

## 2024-12-15 NOTE — Progress Notes (Signed)
 Transfer from ICU to room 539 with family at side.  No acute distress noted.  Orientated to unit/room, safety measures in place, call bell in reach

## 2024-12-15 NOTE — Progress Notes (Signed)
 [facility] PROGRESS NOTE    Progress Note  Faith Alvarez  FMW:[id number] DOB: 1971-07-26 DOA: 12/14/2024 PCP: Patient, No Pcp Per     Brief Narrative:   Faith Alvarez is an 54 y.o. female past medical history significant for diabetes mellitus type 2 with a hemoglobin A1c of 13, was not seen a physician in years has not been taking her medication started vomiting with polydipsia and polyuria polyuria refused to take her medication or see her PCP comes in in DKA   Assessment/Plan:   DKA/diabetes mellitus type 2: Likely due to noncompliance show started on IV fluids and IV insulin . Her gap closed blood glucose improved. Will start on long-acting insulin  NovoLog  for 2 hours with IV insulin .  Pseudohyponatremia: Improving with IV fluids likely due to high blood glucose.  Diabetic mononeuropathy associated with type 2 diabetes mellitus (HCC) noted  DVT prophylaxis: lovenox  Family Communication:daughter Status is: Observation The patient remains OBS appropriate and will d/c before 2 midnights.    Code Status:     Code Status Orders  (From admission, onward)           Start     Ordered   12/14/24 1357  Full code  Continuous       Question:  By:  Answer:  Consent: discussion documented in EHR   12/14/24 1357           Code Status History     Date Active Date Inactive Code Status Order ID Comments User Context   04/01/2017 0039 04/01/2017 1813 Full Code [id number]  Charlton Evalene RAMAN, MD ED         IV Access:   Peripheral IV   Procedures and diagnostic studies:   No results found.   Medical Consultants:   None.   Subjective:    Faith Alvarez no complaints feels great she is relates she is hungry  Objective:    Vitals:   12/14/24 2357 12/15/24 0000 12/15/24 0100 12/15/24 0358  BP:  129/65    Pulse:  (!) 101    Resp:  18    Temp: (!) 103 F (39.4 C)  99.3 F (37.4 C) 99.4 F (37.4 C)  TempSrc:    Oral   SpO2:  98%     SpO2: 98 %   Intake/Output Summary (Last 24 hours) at 12/15/2024 9388 Last data filed at 12/15/2024 9663 Gross per 24 hour  Intake 1880.58 ml  Output --  Net 1880.58 ml   There were no vitals filed for this visit.  Exam: General exam: In no acute distress. Respiratory system: Good air movement and clear to auscultation. Cardiovascular system: S1 & S2 heard, RRR. No JVD. Gastrointestinal system: Abdomen is nondistended, soft and nontender.  Extremities: No pedal edema. Skin: No rashes, lesions or ulcers Psychiatry: Judgement and insight appear normal. Mood & affect appropriate.    Data Reviewed:    Labs: Basic Metabolic Panel: Recent Labs  Lab 12/14/24 1121 12/14/24 1714 12/14/24 1959 12/14/24 2341 12/15/24 0414  NA 128* 134* 133* 133* 134*  K 3.7 4.0 3.7 3.7 3.4*  CL 93* 100 100 100 100  CO2 17* 24 25 24 27   GLUCOSE 302* 215* 179* 211* 144*  BUN 17 15 12 11 9   CREATININE 0.61 0.52 0.47 0.47 0.50  CALCIUM  8.8* 8.7* 8.7* 8.4* 8.4*   GFR CrCl cannot be calculated (Unknown ideal weight.). Liver Function Tests: Recent Labs  Lab 12/14/24 1121  AST 20  ALT 18  ALKPHOS  86  BILITOT 0.5  PROT 7.2  ALBUMIN 3.8   Recent Labs  Lab 12/14/24 1121  LIPASE 19   No results for input(s): AMMONIA in the last 168 hours. Coagulation profile No results for input(s): INR, PROTIME in the last 168 hours. COVID-19 Labs  No results for input(s): DDIMER, FERRITIN, LDH, CRP in the last 72 hours.  Lab Results  Component Value Date   SARSCOV2NAA Not Detected 12/08/2019   SARSCOV2NAA Detected (A) 11/27/2019    CBC: Recent Labs  Lab 12/14/24 1121 12/15/24 0414  WBC 8.7 4.3  NEUTROABS 7.1  --   HGB 14.4 13.7  HCT 41.3 39.5  MCV 89.8 90.0  PLT 146* 145*   Cardiac Enzymes: No results for input(s): CKTOTAL, CKMB, CKMBINDEX, TROPONINI in the last 168 hours. BNP (last 3 results) No results for input(s): PROBNP in the last  8760 hours. CBG: Recent Labs  Lab 12/15/24 0048 12/15/24 0157 12/15/24 0254 12/15/24 0356 12/15/24 0519  GLUCAP 216* 189* 178* 157* 176*   D-Dimer: No results for input(s): DDIMER in the last 72 hours. Hgb A1c: Recent Labs    12/14/24 1714  HGBA1C 13.0*   Lipid Profile: Recent Labs    12/15/24 0414  CHOL 181  HDL 40*  LDLCALC 114*  TRIG 135  CHOLHDL 4.6   Thyroid function studies: No results for input(s): TSH, T4TOTAL, T3FREE, THYROIDAB in the last 72 hours.  Invalid input(s): FREET3 Anemia work up: No results for input(s): VITAMINB12, FOLATE, FERRITIN, TIBC, IRON, RETICCTPCT in the last 72 hours. Sepsis Labs: Recent Labs  Lab 12/14/24 1121 12/15/24 0414  WBC 8.7 4.3   Microbiology No results found for this or any previous visit (from the past 240 hours).   Medications:    enoxaparin  (LOVENOX ) injection  40 mg Subcutaneous Q24H   potassium chloride   20 mEq Oral Once   Continuous Infusions:  dextrose  5% lactated ringers  125 mL/hr at 12/15/24 0336   insulin  3 Units/hr (12/15/24 0520)   lactated ringers  Stopped (12/14/24 1539)      LOS: 0 days   Faith Alvarez  [facility]  12/15/2024, 6:11 AM

## 2024-12-15 NOTE — Plan of Care (Signed)
 Nutrition Education Note  RD consulted for nutrition education regarding diabetes.   Lab Results  Component Value Date   HGBA1C 13.0 (H) 12/14/2024   Patient is Spanish-speaking, used interpretor services with claudia Fines [phone] to speak to patient.  Patient reports she eats everything at home. Usually consumes 2 meals a day, lunch and dinner, and often has foods such as salad, chicken fingers, and wings. Does not eat a lot of sweets but admits to drinking regular soda while at work.  RD provided Carbohydrate Counting for People with Diabetes and Using Nutrition Labels: Carbohydrate handouts in Spanish from the Academy of Nutrition and Dietetics. Discussed different food groups and their effects on blood sugar, emphasizing carbohydrate-containing foods. Provided list of carbohydrates and recommended serving sizes of common foods.  Discussed importance of controlled and consistent carbohydrate intake throughout the day. Provided examples of ways to balance meals/snacks and encouraged intake of high-fiber, whole grain complex carbohydrates. Teach back method used.  Expect fair compliance.  Current diet order is Carb Modified. Labs and medications reviewed. No further nutrition interventions warranted at this time. If additional nutrition issues arise, please re-consult RD.  Trude Ned RD, LDN Contact via [employer].

## 2024-12-16 ENCOUNTER — Other Ambulatory Visit (HOSPITAL_COMMUNITY): Payer: Self-pay

## 2024-12-16 ENCOUNTER — Other Ambulatory Visit: Payer: Self-pay

## 2024-12-16 DIAGNOSIS — R7989 Other specified abnormal findings of blood chemistry: Secondary | ICD-10-CM

## 2024-12-16 LAB — GLUCOSE, CAPILLARY
Glucose-Capillary: 161 mg/dL — ABNORMAL HIGH (ref 70–99)
Glucose-Capillary: 206 mg/dL — ABNORMAL HIGH (ref 70–99)

## 2024-12-16 LAB — BASIC METABOLIC PANEL WITH GFR
Anion gap: 6 (ref 5–15)
BUN: 6 mg/dL (ref 6–20)
CO2: 27 mmol/L (ref 22–32)
Calcium: 8.3 mg/dL — ABNORMAL LOW (ref 8.9–10.3)
Chloride: 99 mmol/L (ref 98–111)
Creatinine, Ser: 0.55 mg/dL (ref 0.44–1.00)
GFR, Estimated: >60 mL/min
Glucose, Bld: 198 mg/dL — ABNORMAL HIGH (ref 70–99)
Potassium: 4 mmol/L (ref 3.5–5.1)
Sodium: 133 mmol/L — ABNORMAL LOW (ref 135–145)

## 2024-12-16 MED ORDER — INSULIN DETEMIR 100 UNIT/ML FLEXPEN: 40.0000 [IU] | PEN_INJECTOR | Freq: Every day | SUBCUTANEOUS | 11 refills | Status: DC

## 2024-12-16 MED ORDER — INSULIN GLARGINE 100 UNIT/ML SOLOSTAR PEN: 40.0000 [IU] | PEN_INJECTOR | Freq: Every day | SUBCUTANEOUS | 11 refills | Status: DC

## 2024-12-16 MED ORDER — BLOOD GLUCOSE TEST VI STRP
1.0000 | ORAL_STRIP | 0 refills | Status: AC
  Filled 2024-12-16: qty 100, 25d supply, fill #0

## 2024-12-16 MED ORDER — INSULIN GLARGINE 100 UNIT/ML SOLOSTAR PEN
40.0000 [IU] | PEN_INJECTOR | Freq: Every day | SUBCUTANEOUS | 11 refills | Status: DC
  Filled 2024-12-16: qty 12, 30d supply, fill #0

## 2024-12-16 MED ORDER — INSULIN PEN NEEDLE 31G X 6 MM MISC: 1.0000 | Freq: Two times a day (BID) | 3 refills | Status: DC

## 2024-12-16 MED ORDER — INSULIN ASPART 100 UNIT/ML FLEXPEN
4.0000 [IU] | PEN_INJECTOR | Freq: Three times a day (TID) | SUBCUTANEOUS | 11 refills | Status: DC
  Filled 2024-12-16: qty 6, 30d supply, fill #0

## 2024-12-16 MED ORDER — LANCET DEVICE MISC: 1.0000 | 0 refills | Status: DC

## 2024-12-16 MED ORDER — LANCET DEVICE MISC
1.0000 | 0 refills | Status: AC
  Filled 2024-12-16: qty 1, fill #0

## 2024-12-16 MED ORDER — BLOOD GLUCOSE TEST VI STRP: 1.0000 | ORAL_STRIP | 0 refills | Status: DC

## 2024-12-16 MED ORDER — LANCETS MISC
1.0000 | 0 refills | Status: AC
  Filled 2024-12-16: qty 100, 25d supply, fill #0

## 2024-12-16 MED ORDER — INSULIN ASPART 100 UNIT/ML FLEXPEN: 4.0000 [IU] | PEN_INJECTOR | Freq: Three times a day (TID) | SUBCUTANEOUS | 11 refills | Status: DC

## 2024-12-16 MED ORDER — LANCETS MISC: 1.0000 | 0 refills | Status: DC

## 2024-12-16 MED ORDER — ROSUVASTATIN CALCIUM 10 MG PO TABS: 10.0000 mg | ORAL_TABLET | Freq: Every day | ORAL | 0 refills | Status: AC

## 2024-12-16 MED ORDER — BLOOD GLUCOSE MONITOR SYSTEM W/DEVICE KIT
1.0000 | PACK | 0 refills | Status: AC
  Filled 2024-12-16: qty 1, 30d supply, fill #0

## 2024-12-16 MED ORDER — BLOOD GLUCOSE MONITORING SUPPL DEVI: 1.0000 | 0 refills | Status: DC

## 2024-12-16 NOTE — Inpatient Diabetes Management (Addendum)
 Inpatient Diabetes Program Recommendations  AACE/ADA: New Consensus Statement on Inpatient Glycemic Control (2015)  Target Ranges:  Prepandial:   less than 140 mg/dL      Peak postprandial:   less than 180 mg/dL (1-2 hours)      Critically ill patients:  140 - 180 mg/dL    Latest Reference Range & Units 12/14/24 17:14  Hemoglobin A1C 4.8 - 5.6 % 13.0 (H)  326 mg/dl  (H): Data is abnormally high  Latest Reference Range & Units 12/15/24 05:19 12/15/24 06:21 12/15/24 07:21 12/15/24 08:35 12/15/24 11:48 12/15/24 17:00 12/15/24 21:23  Glucose-Capillary 70 - 99 mg/dL 823 (H)  IV Insulin  Drip 205 (H) 207 (H)     20 units Semglee  204 (H)  9 units Novolog   IV Insulin  Drip Stopped  178 (H)  7 units Novolog   195 (H)  7 units Novolog   208 (H)  2 units Novolog   20 units Semglee   (H): Data is abnormally high    Admit with: DKA  History: DM2  Home DM Meds: Metformin  1000 mg BID (not taking)   Current Orders: Semglee  20 units BID      Novolog  Moderate Correction Scale/ SSI (0-15 units) TID AC + HS      Novolog  4 units TID with meals   TOC has been consulted for PCP and medication assistance/MATCH  Will also provide patient with information on the [facility] program through the [employer] [facility] [employer]. They can assist her with insulins as she is uninsured.     Met w/ pt and Dtr prior to d/c at bedside this AM.  Dtr speaks English fluently and helps care for her Mom and will be helping with insulin  when pt goes home.  Dtr told me she reviewed insulin  pen with the Diabetes RN yest and feel confident is using insulin  pen at home to give injections to her mom and help her mom learn as well.  I did review with Dtr the difference between the long acting Lantus  and the Rapid acting Novolog  insulins.  Explained the importance of timing of both.  Reviewed doses with Dtr and asked Dtr to re-review doses with discharging RN.  I also strongly encouraged Dtr to  help pt get follow up care (new pt appt/ follow up appt) with one of the [facility] community clinics and I gave her another flyer on getting pt enrolled in the [facility] Program located at [facility] medical center.  Explained to Dtr that she needs to call [facility] phone number asap and get mom enrolled in this program so pt can get her insulins for low cost.  Explained to Dtr that [facility] has Basaglar  (alternative to Lantus ) and Humalog (alternative to Novolog ) and that she can be switched to these insulins so she can get them for low cost at [facility] but she will likely need MD approval to do this so will need appt with the clinic.  Dtr stated understanding.  D/C meds to be sent from [facility] phrmacy Meds to beds program.   --Will follow patient during hospitalization--  Adina Rudolpho Arrow RN, MSN, CDCES Diabetes Coordinator Inpatient Glycemic Control Team Team Pager: [phone] (8a-5p)

## 2024-12-16 NOTE — Progress Notes (Signed)
 Discharge medications delivered to patient at the bedside in a secure bag.

## 2024-12-16 NOTE — Plan of Care (Signed)

## 2024-12-16 NOTE — Plan of Care (Signed)
 " Problem: Education: Goal: Knowledge of General Education information will improve Description: Including pain rating scale, medication(s)/side effects and non-pharmacologic comfort measures 12/16/2024 1337 by Reesa Burnard HERO, RN Outcome: Adequate for Discharge 12/16/2024 1104 by Reesa Burnard HERO, RN Outcome: Progressing   Problem: Health Behavior/Discharge Planning: Goal: Ability to manage health-related needs will improve 12/16/2024 1337 by Reesa Burnard HERO, RN Outcome: Adequate for Discharge 12/16/2024 1104 by Reesa Burnard HERO, RN Outcome: Progressing   Problem: Clinical Measurements: Goal: Ability to maintain clinical measurements within normal limits will improve 12/16/2024 1337 by Reesa Burnard HERO, RN Outcome: Adequate for Discharge 12/16/2024 1104 by Reesa Burnard HERO, RN Outcome: Progressing Goal: Will remain free from infection 12/16/2024 1337 by Reesa Burnard HERO, RN Outcome: Adequate for Discharge 12/16/2024 1104 by Reesa Burnard HERO, RN Outcome: Progressing Goal: Diagnostic test results will improve 12/16/2024 1337 by Reesa Burnard HERO, RN Outcome: Adequate for Discharge 12/16/2024 1104 by Reesa Burnard HERO, RN Outcome: Progressing Goal: Respiratory complications will improve 12/16/2024 1337 by Reesa Burnard HERO, RN Outcome: Adequate for Discharge 12/16/2024 1104 by Reesa Burnard HERO, RN Outcome: Progressing Goal: Cardiovascular complication will be avoided 12/16/2024 1337 by Reesa Burnard HERO, RN Outcome: Adequate for Discharge 12/16/2024 1104 by Reesa Burnard HERO, RN Outcome: Progressing   Problem: Activity: Goal: Risk for activity intolerance will decrease 12/16/2024 1337 by Reesa Burnard HERO, RN Outcome: Adequate for Discharge 12/16/2024 1104 by Reesa Burnard HERO, RN Outcome: Progressing   Problem: Nutrition: Goal: Adequate nutrition will be maintained 12/16/2024 1337 by Reesa Burnard HERO,  RN Outcome: Adequate for Discharge 12/16/2024 1104 by Reesa Burnard HERO, RN Outcome: Progressing   Problem: Coping: Goal: Level of anxiety will decrease 12/16/2024 1337 by Reesa Burnard HERO, RN Outcome: Adequate for Discharge 12/16/2024 1104 by Reesa Burnard HERO, RN Outcome: Progressing   Problem: Elimination: Goal: Will not experience complications related to bowel motility 12/16/2024 1337 by Reesa Burnard HERO, RN Outcome: Adequate for Discharge 12/16/2024 1104 by Reesa Burnard HERO, RN Outcome: Progressing Goal: Will not experience complications related to urinary retention 12/16/2024 1337 by Reesa Burnard HERO, RN Outcome: Adequate for Discharge 12/16/2024 1104 by Reesa Burnard HERO, RN Outcome: Progressing   Problem: Pain Managment: Goal: General experience of comfort will improve and/or be controlled 12/16/2024 1337 by Reesa Burnard HERO, RN Outcome: Adequate for Discharge 12/16/2024 1104 by Reesa Burnard HERO, RN Outcome: Progressing   Problem: Safety: Goal: Ability to remain free from injury will improve 12/16/2024 1337 by Reesa Burnard HERO, RN Outcome: Adequate for Discharge 12/16/2024 1104 by Reesa Burnard HERO, RN Outcome: Progressing   Problem: Skin Integrity: Goal: Risk for impaired skin integrity will decrease 12/16/2024 1337 by Reesa Burnard HERO, RN Outcome: Adequate for Discharge 12/16/2024 1104 by Reesa Burnard HERO, RN Outcome: Progressing   Problem: Education: Goal: Ability to describe self-care measures that may prevent or decrease complications (Diabetes Survival Skills Education) will improve 12/16/2024 1337 by Reesa Burnard HERO, RN Outcome: Adequate for Discharge 12/16/2024 1104 by Reesa Burnard HERO, RN Outcome: Progressing Goal: Individualized Educational Video(s) 12/16/2024 1337 by Reesa Burnard HERO, RN Outcome: Adequate for Discharge 12/16/2024 1104 by Reesa Burnard HERO, RN Outcome:  Progressing   Problem: Coping: Goal: Ability to adjust to condition or change in health will improve 12/16/2024 1337 by Reesa Burnard HERO, RN Outcome: Adequate for Discharge 12/16/2024 1104 by Reesa Burnard HERO, RN Outcome: Progressing   Problem: Fluid Volume: Goal: Ability to maintain a balanced intake and output will improve 12/16/2024 1337 by Hogan-[place], Burnard HERO, RN Outcome: Adequate for Discharge  12/16/2024 1104 by Reesa Burnard HERO, RN Outcome: Progressing   Problem: Health Behavior/Discharge Planning: Goal: Ability to identify and utilize available resources and services will improve 12/16/2024 1337 by Hogan-[place], Burnard HERO, RN Outcome: Adequate for Discharge 12/16/2024 1104 by Reesa Burnard HERO, RN Outcome: Progressing Goal: Ability to manage health-related needs will improve 12/16/2024 1337 by Reesa Burnard HERO, RN Outcome: Adequate for Discharge 12/16/2024 1104 by Reesa Burnard HERO, RN Outcome: Progressing   Problem: Metabolic: Goal: Ability to maintain appropriate glucose levels will improve 12/16/2024 1337 by Reesa Burnard HERO, RN Outcome: Adequate for Discharge 12/16/2024 1104 by Reesa Burnard HERO, RN Outcome: Progressing   Problem: Nutritional: Goal: Maintenance of adequate nutrition will improve 12/16/2024 1337 by Reesa Burnard HERO, RN Outcome: Adequate for Discharge 12/16/2024 1104 by Reesa Burnard HERO, RN Outcome: Progressing Goal: Progress toward achieving an optimal weight will improve 12/16/2024 1337 by Reesa Burnard HERO, RN Outcome: Adequate for Discharge 12/16/2024 1104 by Reesa Burnard HERO, RN Outcome: Progressing   Problem: Skin Integrity: Goal: Risk for impaired skin integrity will decrease 12/16/2024 1337 by Reesa Burnard HERO, RN Outcome: Adequate for Discharge 12/16/2024 1104 by Reesa Burnard HERO, RN Outcome: Progressing   Problem: Tissue Perfusion: Goal: Adequacy of tissue perfusion will  improve 12/16/2024 1337 by Reesa Burnard HERO, RN Outcome: Adequate for Discharge 12/16/2024 1104 by Reesa Burnard HERO, RN Outcome: Progressing   "

## 2024-12-16 NOTE — Plan of Care (Signed)
   Problem: Education: Goal: Knowledge of General Education information will improve Description: Including pain rating scale, medication(s)/side effects and non-pharmacologic comfort measures Outcome: Progressing   Problem: Clinical Measurements: Goal: Will remain free from infection Outcome: Progressing

## 2024-12-16 NOTE — TOC Initial Note (Addendum)
 Transition of Care [facility]) - Initial/Assessment Note    Patient Details  Name: Faith Alvarez MRN: [id number] Date of Birth: [birth year]  [facility] [employer]) CM/SW Contact:    Doneta Glenys DASEN, RN Phone Number: 12/16/2024, 11:17 AM  Clinical Narrative:                 CM spoke with patient and daughter Brent in the room. PCP resources given and MATCH.    Expected Discharge Plan: Home/Self Care Barriers to Discharge: Continued Medical Work up, Barriers Resolved   Patient Goals and CMS Choice Patient states their goals for this hospitalization and ongoing recovery are:: Home with daughter CMS Medicare.gov Compare Post Acute Care list provided to:: Patient Represenative (must comment) Choice offered to / list presented to : Adult Children [place] ownership interest in [facility].provided to:: Adult Children    Expected Discharge Plan and Services In-house Referral: NA Discharge Planning Services: MATCH Program   Living arrangements for the past 2 months: Single Family Home Expected Discharge Date: 12/16/24               DME Arranged: N/A DME Agency: NA       HH Arranged: NA HH Agency: NA        Prior Living Arrangements/Services Living arrangements for the past 2 months: Single Family Home Lives with:: Adult Children Patient language and need for interpreter reviewed:: Yes Do you feel safe going back to the place where you live?: Yes      Need for Family Participation in Patient Care: Yes (Comment) Care giver support system in place?: Yes (comment)   Criminal Activity/Legal Involvement Pertinent to Current Situation/Hospitalization: No - Comment as needed  Activities of Daily Living      Permission Sought/Granted Permission sought to share information with : Case Manager Permission granted to share information with : Yes, Verbal Permission Granted  Share Information with NAME: Zepeda,Anahi  Daughter, Emergency Contact  [phone]  (Mobile           Emotional Assessment Appearance:: Appears stated age       Alcohol / Substance Use: Not Applicable Psych Involvement: No (comment)  Admission diagnosis:  DKA (diabetic ketoacidosis) (HCC) [E11.10] Diabetic ketoacidosis without coma associated with other specified diabetes mellitus (HCC) [E13.10] Patient Active Problem List   Diagnosis Date Noted   Pseudohyponatremia 12/15/2024   DKA (diabetic ketoacidosis) (HCC) 12/14/2024   Anxiety and depression 10/16/2019   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 ([address]), [place] - [address] [place] ([place]) [place] [address] Phone: [phone] Fax: [phone]  [facility] [address] [place] [address] Phone: [phone] Fax: [phone]  [facility] - [facility] [address] [place] [address] Phone: [phone] Fax: [phone]     Social Drivers of Health (SDOH) Social History: SDOH Screenings   Food Insecurity: No  Food Insecurity (12/14/2024)  Housing: Low Risk (12/14/2024)  Transportation Needs: No Transportation Needs (12/14/2024)  Utilities: Not At Risk (12/14/2024)  Tobacco Use: Low Risk (12/14/2024)   SDOH Interventions:     Readmission Risk Interventions     No data to display

## 2024-12-16 NOTE — Discharge Summary (Addendum)
 Physician Discharge Summary  Faith Alvarez FMW:[id number] DOB: 1971/07/23 DOA: 12/14/2024  PCP: Patient, No Pcp Per  Admit date: 12/14/2024 Discharge date: 12/16/2024  Admitted From: Home Disposition:  Home  Recommendations for Outpatient Follow-up:  Follow up with PCP in 1-2 weeks Please obtain BMP/CBC in one week   Home Health:No Equipment/Devices:None  Discharge Condition:Stable CODE STATUS:Full Diet recommendation: Heart Healthy  Brief/Interim Summary:  54 y.o. female past medical history significant for diabetes mellitus type 2 with a hemoglobin A1c of 13, was not seen a physician in years has not been taking her medication started vomiting with polydipsia and polyuria polyuria refused to take her medication or see her PCP comes in in DKA   Discharge Diagnoses:  Active Problems:   Diabetic mononeuropathy associated with type 2 diabetes mellitus (HCC)   DKA (diabetic ketoacidosis) (HCC)   Pseudohyponatremia  DKA diabetes mellitus type 2 due to noncompliance: Likely due to noncompliance will start on IV fluids and IV insulin  her blood glucose improved gap closed bicarb greater than 20. Transition to long-acting insulin  40 units daily plus sliding scale 4 units with meals.  Pseudohyponatremia: Improved with IV fluids.  Diabetic peripheral neuropathy: Noted to follow-up PCP as an outpatient.  Discharge Instructions   Allergies as of 12/16/2024   No Known Allergies      Medication List     TAKE these medications    Blood Glucose Monitor System w/Device Kit Use to check blood sugar three times daily as directed  in the morning, at noon, and at bedtime What changed: Another medication with the same name was added. Make sure you understand how and when to take each.   Blood Glucose Monitoring Suppl Devi 1 each by Does not apply route as directed. Dispense based on patient and insurance preference. Use up to four times daily as directed. (FOR ICD-10 E10.9,  E11.9). What changed: You were already taking a medication with the same name, and this prescription was added. Make sure you understand how and when to take each.   BLOOD GLUCOSE TEST STRIPS Strp 1 each by Does not apply route as directed. Dispense based on patient and insurance preference. Use up to four times daily as directed. (FOR ICD-10 E10.9, E11.9).   insulin  aspart 100 UNIT/ML FlexPen Commonly known as: NOVOLOG  Inject 4 Units into the skin 3 (three) times daily with meals.   insulin  glargine 100 UNIT/ML Solostar Pen Commonly known as: LANTUS  Inject 40 Units into the skin daily.   Insulin  Pen Needle 31G X 6 MM Misc 1 Device by Does not apply route 2 (two) times daily.   Lancet Device Misc 1 each by Does not apply route as directed. Dispense based on patient and insurance preference. Use up to four times daily as directed. (FOR ICD-10 E10.9, E11.9).   Lancets Misc 1 each by Does not apply route as directed. Dispense based on patient and insurance preference. Use up to four times daily as directed. (FOR ICD-10 E10.9, E11.9).   metFORMIN  1000 MG tablet Commonly known as: GLUCOPHAGE  Take 1 tablet (1,000 mg total) by mouth 2 (two) times daily with a meal.   rosuvastatin  10 MG tablet Commonly known as: CRESTOR  Take 1 tablet (10 mg total) by mouth daily. Start taking on: December 17, 2024        Allergies[1]  Consultations: None  Procedures/Studies: No results found. (Echo, Carotid, EGD, Colonoscopy, ERCP)    Subjective: No complaints today feels great.  Discharge Exam: Vitals:   12/15/24 2125 12/16/24  0526  BP: 120/62 113/78  Pulse:  70  Resp:  19  Temp:  97.8 F (36.6 C)  SpO2:  100%   Vitals:   12/15/24 1827 12/15/24 2026 12/15/24 2125 12/16/24 0526  BP:  (!) 97/56 120/62 113/78  Pulse:  81  70  Resp:  17  19  Temp: 98.2 F (36.8 C) 99.6 F (37.6 C)  97.8 F (36.6 C)  TempSrc: Oral     SpO2:  99%  100%    General: Pt is alert, awake, not in  acute distress Cardiovascular: RRR, S1/S2 +, no rubs, no gallops Respiratory: CTA bilaterally, no wheezing, no rhonchi Abdominal: Soft, NT, ND, bowel sounds + Extremities: no edema, no cyanosis    The results of significant diagnostics from this hospitalization (including imaging, microbiology, ancillary and laboratory) are listed below for reference.     Microbiology: No results found for this or any previous visit (from the past 240 hours).   Labs: BNP (last 3 results) No results for input(s): BNP in the last 8760 hours. Basic Metabolic Panel: Recent Labs  Lab 12/14/24 1714 12/14/24 1959 12/14/24 2341 12/15/24 0414 12/16/24 0441  NA 134* 133* 133* 134* 133*  K 4.0 3.7 3.7 3.4* 4.0  CL 100 100 100 100 99  CO2 24 25 24 27 27   GLUCOSE 215* 179* 211* 144* 198*  BUN 15 12 11 9 6   CREATININE 0.52 0.47 0.47 0.50 0.55  CALCIUM  8.7* 8.7* 8.4* 8.4* 8.3*   Liver Function Tests: Recent Labs  Lab 12/14/24 1121  AST 20  ALT 18  ALKPHOS 86  BILITOT 0.5  PROT 7.2  ALBUMIN 3.8   Recent Labs  Lab 12/14/24 1121  LIPASE 19   No results for input(s): AMMONIA in the last 168 hours. CBC: Recent Labs  Lab 12/14/24 1121 12/15/24 0414  WBC 8.7 4.3  NEUTROABS 7.1  --   HGB 14.4 13.7  HCT 41.3 39.5  MCV 89.8 90.0  PLT 146* 145*   Cardiac Enzymes: No results for input(s): CKTOTAL, CKMB, CKMBINDEX, TROPONINI in the last 168 hours. BNP: Invalid input(s): POCBNP CBG: Recent Labs  Lab 12/15/24 0835 12/15/24 1148 12/15/24 1700 12/15/24 2123 12/16/24 0808  GLUCAP 204* 178* 195* 208* 161*   D-Dimer No results for input(s): DDIMER in the last 72 hours. Hgb A1c Recent Labs    12/14/24 1714  HGBA1C 13.0*   Lipid Profile Recent Labs    12/15/24 0414  CHOL 181  HDL 40*  LDLCALC 114*  TRIG 135  CHOLHDL 4.6   Thyroid function studies No results for input(s): TSH, T4TOTAL, T3FREE, THYROIDAB in the last 72 hours.  Invalid input(s):  FREET3 Anemia work up No results for input(s): VITAMINB12, FOLATE, FERRITIN, TIBC, IRON, RETICCTPCT in the last 72 hours. Urinalysis    Component Value Date/Time   COLORURINE YELLOW 12/14/2024 1121   APPEARANCEUR HAZY (A) 12/14/2024 1121   LABSPEC 1.025 12/14/2024 1121   PHURINE 5.0 12/14/2024 1121   GLUCOSEU >=500 (A) 12/14/2024 1121   HGBUR NEGATIVE 12/14/2024 1121   BILIRUBINUR NEGATIVE 12/14/2024 1121   BILIRUBINUR neg 12/31/2017 1538   KETONESUR 80 (A) 12/14/2024 1121   PROTEINUR 100 (A) 12/14/2024 1121   UROBILINOGEN 1.0 12/31/2017 1538   NITRITE NEGATIVE 12/14/2024 1121   LEUKOCYTESUR TRACE (A) 12/14/2024 1121   Sepsis Labs Recent Labs  Lab 12/14/24 1121 12/15/24 0414  WBC 8.7 4.3   Microbiology No results found for this or any previous visit (from the past 240 hours).  Time coordinating discharge: Over 30 minutes  SIGNED:   Erle Odell Castor, MD  [facility] 12/16/2024, 11:29 AM Pager   If 7PM-7AM, please contact night-coverage [email or web address] Password [email or web address]     [1] No Known Allergies

## 2024-12-18 ENCOUNTER — Ambulatory Visit: Payer: Self-pay

## 2024-12-21 LAB — BLOOD GAS, VENOUS
Acid-Base Excess: 2.7 mmol/L — ABNORMAL HIGH (ref 0.0–2.0)
Bicarbonate: 27.9 mmol/L (ref 20.0–28.0)
O2 Saturation: 38.5 %
Patient temperature: 36.8
pCO2, Ven: 44 mmHg (ref 44–60)
pH, Ven: 7.41 (ref 7.25–7.43)
pO2, Ven: <31 mmHg — CL (ref 32–45)

## 2024-12-24 ENCOUNTER — Ambulatory Visit: Payer: Self-pay

## 2024-12-24 NOTE — Telephone Encounter (Signed)
" °  FYI Only or Action Required?: FYI only for provider: appointment scheduled on 2/4.  Patient was last seen in primary care on .  Called Nurse Triage reporting Blood Sugar Problem.  Symptoms began several months ago.  Interventions attempted: Nothing.  Symptoms are: unchanged.  Triage Disposition: See PCP When Office is Open (Within 3 Days)  Patient/caregiver understands and will follow disposition?: Yes   Copied from CRM #[id number]. Topic: Clinical - Red Word Triage >> Dec 24, 2024 11:14 AM Faith Alvarez wrote: Red Word that prompted transfer to Nurse Triage: Pt's daughter Brent is calling, reports pt's blood sugar this morning was 404. No other symptoms, seeking to establish. Discharged from the hospital 01/06 Reason for Disposition  Caller requesting an appointment, triage offered and declined  Answer Assessment - Initial Assessment Questions In hospital recently for high BS. Daughter states was report blood sugar was hight this am but is not with pt at this time  1. REASON FOR CALL or QUESTION: What is your reason for calling today? or How can I best     Requesting new pt appt.  2. CALLER: Document the source of call. (e.g., laboratory staff, caregiver or patient).     daughter  Protocols used: PCP Call - No Triage-A-AH  "

## 2025-01-14 ENCOUNTER — Ambulatory Visit (HOSPITAL_BASED_OUTPATIENT_CLINIC_OR_DEPARTMENT_OTHER): Payer: Self-pay

## 2025-01-15 ENCOUNTER — Other Ambulatory Visit: Payer: Self-pay | Admitting: Family Medicine

## 2025-01-15 ENCOUNTER — Other Ambulatory Visit: Payer: Self-pay

## 2025-01-15 DIAGNOSIS — E1110 Type 2 diabetes mellitus with ketoacidosis without coma: Secondary | ICD-10-CM

## 2025-01-15 MED ORDER — INSULIN PEN NEEDLE 31G X 6 MM MISC
1.0000 | Freq: Four times a day (QID) | 11 refills | Status: AC
  Filled 2025-01-15: qty 100, 25d supply, fill #0

## 2025-01-15 MED ORDER — INSULIN LISPRO (1 UNIT DIAL) 100 UNIT/ML (KWIKPEN)
4.0000 [IU] | PEN_INJECTOR | Freq: Three times a day (TID) | SUBCUTANEOUS | 11 refills | Status: AC
  Filled 2025-01-15: qty 3, 25d supply, fill #0

## 2025-01-15 MED ORDER — INSULIN LISPRO 200 UNIT/ML ~~LOC~~ SOPN
4.0000 [IU] | PEN_INJECTOR | Freq: Three times a day (TID) | SUBCUTANEOUS | 11 refills | Status: DC
  Filled 2025-01-15: qty 15, fill #0

## 2025-01-15 MED ORDER — BASAGLAR KWIKPEN 100 UNIT/ML ~~LOC~~ SOPN
40.0000 [IU] | PEN_INJECTOR | Freq: Every day | SUBCUTANEOUS | 11 refills | Status: AC
  Filled 2025-01-15: qty 15, 37d supply, fill #0

## 2025-01-15 NOTE — Progress Notes (Signed)
 Contacted by [facility], TOC and DM coordinators team to assist with prescription change for insulin .  Insurance coverage for equivalent insulins needed.  Rx sent.  Confirmed with pharmacy.

## 2025-01-15 NOTE — Addendum Note (Signed)
 Addended by: JONEL LONNI SQUIBB on: 01/15/2025 04:10 PM   Modules accepted: Orders

## 2025-02-05 ENCOUNTER — Ambulatory Visit (HOSPITAL_BASED_OUTPATIENT_CLINIC_OR_DEPARTMENT_OTHER): Payer: Self-pay

## 2025-02-17 ENCOUNTER — Ambulatory Visit: Payer: Self-pay

## 2025-02-24 ENCOUNTER — Ambulatory Visit: Payer: Self-pay
